# Patient Record
Sex: Female | Born: 1955 | Race: White | Hispanic: No | Marital: Single | Smoking: Never smoker
Health system: Southern US, Community
[De-identification: ages and names within clinical notes are randomized; demographics above are authoritative.]

## PROBLEM LIST (undated history)

## (undated) DIAGNOSIS — K635 Polyp of colon: Secondary | ICD-10-CM

## (undated) DIAGNOSIS — T7840XA Allergy, unspecified, initial encounter: Secondary | ICD-10-CM

## (undated) DIAGNOSIS — E039 Hypothyroidism, unspecified: Secondary | ICD-10-CM

## (undated) DIAGNOSIS — Z86718 Personal history of other venous thrombosis and embolism: Secondary | ICD-10-CM

## (undated) DIAGNOSIS — E785 Hyperlipidemia, unspecified: Secondary | ICD-10-CM

## (undated) HISTORY — PX: COLONOSCOPY: SHX174

## (undated) HISTORY — PX: SHOULDER SURGERY: SHX246

## (undated) HISTORY — DX: Allergy, unspecified, initial encounter: T78.40XA

## (undated) HISTORY — DX: Personal history of other venous thrombosis and embolism: Z86.718

## (undated) HISTORY — DX: Polyp of colon: K63.5

## (undated) HISTORY — DX: Hyperlipidemia, unspecified: E78.5

## (undated) HISTORY — DX: Hypothyroidism, unspecified: E03.9

---

## 1987-10-09 HISTORY — PX: BREAST BIOPSY: SHX20

## 1997-11-12 ENCOUNTER — Ambulatory Visit (HOSPITAL_COMMUNITY): Admission: RE | Admit: 1997-11-12 | Discharge: 1997-11-12 | Payer: Self-pay | Admitting: Obstetrics & Gynecology

## 2000-11-20 ENCOUNTER — Other Ambulatory Visit: Admission: RE | Admit: 2000-11-20 | Discharge: 2000-11-20 | Payer: Self-pay | Admitting: Obstetrics & Gynecology

## 2002-07-09 ENCOUNTER — Other Ambulatory Visit: Admission: RE | Admit: 2002-07-09 | Discharge: 2002-07-09 | Payer: Self-pay | Admitting: Obstetrics & Gynecology

## 2004-04-27 ENCOUNTER — Ambulatory Visit (HOSPITAL_COMMUNITY): Admission: RE | Admit: 2004-04-27 | Discharge: 2004-04-27 | Payer: Self-pay | Admitting: Emergency Medicine

## 2004-05-12 ENCOUNTER — Other Ambulatory Visit: Admission: RE | Admit: 2004-05-12 | Discharge: 2004-05-12 | Payer: Self-pay | Admitting: Obstetrics & Gynecology

## 2004-07-31 ENCOUNTER — Encounter: Admission: RE | Admit: 2004-07-31 | Discharge: 2004-07-31 | Payer: Self-pay | Admitting: Obstetrics and Gynecology

## 2004-08-14 ENCOUNTER — Encounter: Admission: RE | Admit: 2004-08-14 | Discharge: 2004-08-14 | Payer: Self-pay | Admitting: Obstetrics & Gynecology

## 2004-12-04 ENCOUNTER — Encounter: Admission: RE | Admit: 2004-12-04 | Discharge: 2004-12-04 | Payer: Self-pay | Admitting: Obstetrics & Gynecology

## 2005-07-12 ENCOUNTER — Other Ambulatory Visit: Admission: RE | Admit: 2005-07-12 | Discharge: 2005-07-12 | Payer: Self-pay | Admitting: Obstetrics & Gynecology

## 2007-09-10 ENCOUNTER — Encounter: Admission: RE | Admit: 2007-09-10 | Discharge: 2007-09-10 | Payer: Self-pay | Admitting: Obstetrics & Gynecology

## 2009-07-15 ENCOUNTER — Encounter: Admission: RE | Admit: 2009-07-15 | Discharge: 2009-07-15 | Payer: Self-pay | Admitting: Obstetrics & Gynecology

## 2012-04-26 ENCOUNTER — Ambulatory Visit (INDEPENDENT_AMBULATORY_CARE_PROVIDER_SITE_OTHER): Payer: BC Managed Care – PPO | Admitting: Physician Assistant

## 2012-04-26 ENCOUNTER — Ambulatory Visit: Payer: BC Managed Care – PPO

## 2012-04-26 VITALS — BP 128/76 | HR 84 | Temp 98.1°F | Resp 16 | Ht 65.0 in | Wt 187.0 lb

## 2012-04-26 DIAGNOSIS — M2550 Pain in unspecified joint: Secondary | ICD-10-CM

## 2012-04-26 DIAGNOSIS — E785 Hyperlipidemia, unspecified: Secondary | ICD-10-CM

## 2012-04-26 DIAGNOSIS — E039 Hypothyroidism, unspecified: Secondary | ICD-10-CM

## 2012-04-26 LAB — POCT CBC
Granulocyte percent: 44.8 %G (ref 37–80)
HCT, POC: 45.3 % (ref 37.7–47.9)
Hemoglobin: 13.8 g/dL (ref 12.2–16.2)
Lymph, poc: 2.7 (ref 0.6–3.4)
MCH, POC: 30 pg (ref 27–31.2)
MCHC: 30.5 g/dL — AB (ref 31.8–35.4)
MCV: 98.5 fL — AB (ref 80–97)
MID (cbc): 0.5 (ref 0–0.9)
MPV: 9.1 fL (ref 0–99.8)
POC Granulocyte: 2.6 (ref 2–6.9)
POC LYMPH PERCENT: 47.4 %L (ref 10–50)
POC MID %: 7.8 %M (ref 0–12)
Platelet Count, POC: 408 10*3/uL (ref 142–424)
RBC: 4.6 M/uL (ref 4.04–5.48)
RDW, POC: 13.1 %
WBC: 5.8 10*3/uL (ref 4.6–10.2)

## 2012-04-26 LAB — COMPREHENSIVE METABOLIC PANEL
ALT: 16 U/L (ref 0–35)
AST: 20 U/L (ref 0–37)
Albumin: 4.5 g/dL (ref 3.5–5.2)
Alkaline Phosphatase: 104 U/L (ref 39–117)
BUN: 10 mg/dL (ref 6–23)
CO2: 27 mEq/L (ref 19–32)
Calcium: 9.7 mg/dL (ref 8.4–10.5)
Chloride: 107 mEq/L (ref 96–112)
Creat: 0.67 mg/dL (ref 0.50–1.10)
Glucose, Bld: 101 mg/dL — ABNORMAL HIGH (ref 70–99)
Potassium: 4.4 mEq/L (ref 3.5–5.3)
Sodium: 141 mEq/L (ref 135–145)
Total Bilirubin: 0.7 mg/dL (ref 0.3–1.2)
Total Protein: 7.3 g/dL (ref 6.0–8.3)

## 2012-04-26 LAB — LIPID PANEL
Cholesterol: 228 mg/dL — ABNORMAL HIGH (ref 0–200)
HDL: 66 mg/dL (ref >39)
LDL Cholesterol: 137 mg/dL — ABNORMAL HIGH (ref 0–99)
Total CHOL/HDL Ratio: 3.5 Ratio
Triglycerides: 126 mg/dL (ref <150)
VLDL: 25 mg/dL (ref 0–40)

## 2012-04-26 LAB — RHEUMATOID FACTOR: Rheumatoid fact SerPl-aCnc: 69 IU/mL — ABNORMAL HIGH (ref <=14)

## 2012-04-26 LAB — TSH: TSH: 0.016 u[IU]/mL — ABNORMAL LOW (ref 0.350–4.500)

## 2012-04-26 LAB — POCT SEDIMENTATION RATE: POCT SED RATE: 33 mm/hr — AB (ref 0–22)

## 2012-04-26 MED ORDER — HYDROCODONE-ACETAMINOPHEN 5-325 MG PO TABS: 1.0000 | ORAL_TABLET | ORAL | Status: AC | PRN

## 2012-04-26 MED ORDER — MELOXICAM 15 MG PO TABS: 15.0000 mg | ORAL_TABLET | Freq: Every day | ORAL | Status: DC

## 2012-04-26 NOTE — Progress Notes (Signed)
56 year old White is here today with complaints of R thumb pain and medication refill. Pt states she has not has any falls or injuries. Pt states she woke up early Monday morning with thumb pain.  Pt states she also needs a refill on Synthroid 125 mcg. Pt is fasting. Pt states she has no other complaints.

## 2012-04-26 NOTE — Progress Notes (Signed)
Subjective:    Patient ID: Alisha Welch, female    DOB: [birth year], 56 y.o.   MRN: [id number]  HPI Alisha Welch comes in today complaining of right thumb pain for 6 days.  She states that she was awakened by thumb pain Monday morning.  She flexed her thumb and notes that it got struck at the IP joint in flexion.  She had to force it into extension. She has had increasing pain and swelling of the whole thumb and notes that the sensation is moving up her arm.  She has not noticed any redness or warmth.  She has no other arthralgias.  She has never had a trigger finger before.  She denies any injury or repetitive motion although states that she was pulling weeds several days prior to the onset of pain.  She has taken ibuprofen without relief.   She requests refill of her Synthroid.  Her last TSH was August 2012 and was low.  She did not adjust from 125 and never knew to follow up.  She feels well, though, is not having new symptoms.  She notes that her cholesterol was elevated last year as well.  She has made some lifestyle changes but has not had labs rechecked.   No CPE in some time.     Review of Systems As noted in HPI, otherwise negative     Objective:   Physical Exam  Constitutional: Vital signs are normal. She appears well-developed and well-nourished.  Neck: Thyromegaly present. No mass present.  Cardiovascular: Normal rate and regular rhythm.   Pulmonary/Chest: Effort normal and breath sounds normal.  Musculoskeletal:       Right hand: She exhibits decreased range of motion, tenderness and bony tenderness. She exhibits normal capillary refill, no laceration and no swelling. normal sensation noted.       Right thumb is slightly swollen.  Tender to palpation most significantly over palmar aspect of MCP and less tender at IP joint.  Unable to flexion due to pain.  Abduction and adduction are slightly painful. No erythema or warmth noted.   No pain with wrist ROM.  Neurological: She is  alert. No sensory deficit.    Results for orders placed in visit on 04/26/12  POCT CBC      Component Value Range   WBC 5.8  4.6 - 10.2 K/uL   Lymph, poc 2.7  0.6 - 3.4   POC LYMPH PERCENT 47.4  10 - 50 %L   MID (cbc) 0.5  0 - 0.9   POC MID % 7.8  0 - 12 %M   POC Granulocyte 2.6  2 - 6.9   Granulocyte percent 44.8  37 - 80 %G   RBC 4.60  4.04 - 5.48 M/uL   Hemoglobin 13.8  12.2 - 16.2 g/dL   HCT, POC 40.9  81.1 - 47.9 %   MCV 98.5 (*) 80 - 97 fL   MCH, POC 30.0  27 - 31.2 pg   MCHC 30.5 (*) 31.8 - 35.4 g/dL   RDW, POC 91.4     Platelet Count, POC 408  142 - 424 K/uL   MPV 9.1  0 - 99.8 fL    [facility] reading (PRIMARY) by  Dr. Perrin Maltese Negative        Assessment & Plan:  Hypothyroidism Dyslipidemia Thumb pain, likely trigger finger/tenosynovitis from over use  Check Sed Rate, Lipid, TSH, ANA, RF Mobic 15mg  qd Vicodin for pain Thumb spica splint Ice Call if no improvement  1 week Refill Synthroid when labs returned Needs CPE

## 2012-04-28 LAB — ANA: Anti Nuclear Antibody(ANA): NEGATIVE

## 2012-05-04 ENCOUNTER — Telehealth: Payer: Self-pay

## 2012-05-04 NOTE — Telephone Encounter (Signed)
Pt received letter that we could not reach her about her labs. Please call cell [phone], I am correcting phone# in Epic

## 2012-05-04 NOTE — Telephone Encounter (Signed)
ADVISED PT OF LAB RESULTS.  PT WOULD LIKE TO BE REFERRED TO RHEUMATOLOGY.  SHE STARTS WORK ON AUG 21ST AND WOULD LIKE SOMETHING BEFORE THEN.  PT NOT AVAILABLE ON AUG 8 OR 12.  IF APPT MADE AFTER AUG 21ST THEN THE APPT NEEDS TO LIKE AT 4PM.

## 2012-05-05 ENCOUNTER — Telehealth: Payer: Self-pay

## 2012-05-05 DIAGNOSIS — R768 Other specified abnormal immunological findings in serum: Secondary | ICD-10-CM

## 2012-05-05 DIAGNOSIS — M79643 Pain in unspecified hand: Secondary | ICD-10-CM

## 2012-05-05 NOTE — Telephone Encounter (Signed)
I have spoken to Dr Patsy Lager and she does want to refer patient to a Rheumatologist but does not think patient definitely has RA there are other factors involved other than just abnormal labs. I have called patient and advised this is not a diagnosis, this is some abnormal labs that need to be further elevated. Will work on this for her. She will d/c her splint and come back in for eval if not improved.

## 2012-05-05 NOTE — Telephone Encounter (Signed)
PT STATES SHE WAS SEEN BY ALICIA LAST WEEK, HER RIGHT HAND IS TOTALLY NUMB AND SHE IS REALLY CONCERNED ABOUT IT. HAD SPOKEN WITH SOMEONE ON Saturday BUT WAS SATISFIED WITH WHAT SHE WAS TOLD. ONLY WANT A DR OR PA TO GIVE HER A CALL BACK. PLEASE CALL [phone]

## 2012-05-05 NOTE — Telephone Encounter (Signed)
I spoke to patient and she is very tearful and she does not know how much she needs to be using her hand. She is c/o increased pain ans swelling of her entire hand including thumb. She wants to know how much activity she should be doing and if there is anything else she should or should not be doing. Please advise, I told her if she is getting worse we may need to see her again.

## 2012-05-05 NOTE — Addendum Note (Signed)
Addended byCaffie Damme on: 05/05/2012 12:00 PM   Modules accepted: Orders

## 2012-05-06 ENCOUNTER — Encounter (HOSPITAL_BASED_OUTPATIENT_CLINIC_OR_DEPARTMENT_OTHER): Payer: Self-pay | Admitting: Emergency Medicine

## 2012-05-06 ENCOUNTER — Emergency Department (HOSPITAL_BASED_OUTPATIENT_CLINIC_OR_DEPARTMENT_OTHER)
Admission: EM | Admit: 2012-05-06 | Discharge: 2012-05-06 | Disposition: A | Discharge status: Home / Self Care | Payer: BC Managed Care – PPO | Attending: Emergency Medicine | Admitting: Emergency Medicine

## 2012-05-06 DIAGNOSIS — E039 Hypothyroidism, unspecified: Secondary | ICD-10-CM | POA: Insufficient documentation

## 2012-05-06 DIAGNOSIS — M79609 Pain in unspecified limb: Secondary | ICD-10-CM | POA: Insufficient documentation

## 2012-05-06 DIAGNOSIS — M79643 Pain in unspecified hand: Secondary | ICD-10-CM

## 2012-05-06 MED ORDER — ONDANSETRON 8 MG PO TBDP
8.0000 mg | ORAL_TABLET | ORAL | Status: AC
  Administered 2012-05-06: 8 mg via ORAL
  Filled 2012-05-06: qty 1

## 2012-05-06 MED ORDER — OXYCODONE-ACETAMINOPHEN 5-325 MG PO TABS: 1.0000 | ORAL_TABLET | Freq: Four times a day (QID) | ORAL | Status: AC | PRN

## 2012-05-06 MED ORDER — HYDROMORPHONE HCL PF 1 MG/ML IJ SOLN
1.0000 mg | Freq: Once | INTRAMUSCULAR | Status: AC
  Administered 2012-05-06: 1 mg via INTRAMUSCULAR
  Filled 2012-05-06: qty 1

## 2012-05-06 NOTE — ED Notes (Signed)
MD at bedside. 

## 2012-05-06 NOTE — ED Notes (Signed)
Pt has pain in right thumb. Pt was seen by urgent care July 15 for same with no relief of pain.

## 2012-05-06 NOTE — ED Provider Notes (Signed)
History     CSN: [id number]  Arrival date & time 05/06/12  0141   First MD Initiated Contact with Patient 05/06/12 0158      Chief Complaint  Patient presents with  . Hand Pain    (Consider location/radiation/quality/duration/timing/severity/associated sxs/prior treatment) HPI Pt with pain in right thumb.  Pain has been present for approx 2 weeks. Pain is constant and throbbing.  She also feels that thumb/hand/first finger are swollen.  No redness overlying, no fevers.  No known trauma.  Pt was seen at [facility] 2 weeks ago- was placed in wrist splint, given pain control.  Pain meds ran out yesterday.  Tonight the pain is worse.  She also had labs drawn at that time and RH factor was positive.  She is making arrangements to f/u with rheumatology.  Movement and palpation makes symptoms worse.  There are no other associated systemic symptoms, there are no other alleviating or modifying factors.  There are no other associated systemic symptoms, there are no other alleviating or modifying factors.   Past Medical History  Diagnosis Date  . Hypothyroid     Past Surgical History  Procedure Date  . Breast biopsy     No family history on file.  History  Substance Use Topics  . Smoking status: Never Smoker   . Smokeless tobacco: Not on file  . Alcohol Use: Yes     occassionally    OB History    Grav Para Term Preterm Abortions TAB SAB Ect Mult Living                  Review of Systems ROS reviewed and all otherwise negative except for mentioned in HPI  Allergies  Review of patient's allergies indicates no known allergies.  Home Medications   Current Outpatient Rx  Name Route Sig Dispense Refill  . LEVOTHYROXINE SODIUM 125 MCG PO TABS Oral Take 125 mcg by mouth daily.    . MELOXICAM 15 MG PO TABS Oral Take 1 tablet (15 mg total) by mouth daily. 30 tablet 0  . OXYCODONE-ACETAMINOPHEN 5-325 MG PO TABS Oral Take 1-2 tablets by mouth every 6 (six) hours as needed for  pain. 15 tablet 0    BP 164/83  Pulse 80  Temp 97.9 F (36.6 C) (Oral)  Resp 24  Ht 5\' 5"  (1.651 m)  SpO2 100% Vitals reviewed Physical Exam Physical Examination: General appearance - alert, well appearing, and in no distress Mental status - alert, oriented to person, place, and time Eyes - no conjunctival injection or scleral icterus Mouth - mucous membranes moist, pharynx normal without lesions Chest - clear to auscultation, no wheezes, rales or rhonchi, symmetric air entry Heart - normal rate, regular rhythm, normal S1, S2, no murmurs, rubs, clicks or gallops Neurological - alert, oriented, normal speech, normal grip strength bilaterally, flexion and extension intact of hands/fingers bilaterally, sensation intact distally to light touch Musculoskeletal - diffuse ttp over MP joint, thenar eminence, PIP joint of thumb and PIP joint of 2nd finger,no deformity, no overlying erythema or warmth Extremities - peripheral pulses normal, no pedal edema, no clubbing or cyanosis Skin - normal coloration and turgor, no rashes, no suspicious skin lesions noted Psych- anxious and tearful during history  ED Course  Procedures (including critical care time)  Labs Reviewed - No data to display No results found.   1. Pain in hand       MDM  Pt presenting with pain nad swelling in right hand  over the past 2 weeks.  Pain meds ran out yesterday.  She is arranging rheum followup through her PMD.  Low suspicion for septic joint, xrays were obtained 2 weeks ago and no new trauma.  Pt advised to continue with wrist splint and given rx for pain medicaiton.  She is going to see her PMD tomorrow.  Also given referral to hand surgery        Ethelda Chick, MD 05/08/12 1315

## 2012-05-06 NOTE — ED Notes (Signed)
Pt was dx with "trigger thumb"

## 2012-05-07 ENCOUNTER — Telehealth: Payer: Self-pay

## 2012-05-07 NOTE — Telephone Encounter (Signed)
Labs printed and x-rays on disc. Patient notified and on her way.

## 2012-05-07 NOTE — Telephone Encounter (Signed)
The patient would like copy of blood work and xray done 04/26/12 for appointment with hand specialist tomorrow.  The patient would like to pick the copies up today before 7pm.  Please call the patient at [phone] when ready.

## 2012-05-07 NOTE — Telephone Encounter (Signed)
PT WOULD LIKE A COPY OF HER BLOOD WORK AND X-RAY FROM HER VISIT ON July 20TH, PT STATES THAT SHE BETTER HAVE IT TODAY. [phone]

## 2012-05-10 NOTE — Telephone Encounter (Signed)
I am happy to refer pt to rheumatology but at her visit it looked like Helmut Muster thought she had a trigger finger and with her labs normal a ortho might be a better referral.  Please see which one pt wold like.

## 2012-05-11 NOTE — Telephone Encounter (Signed)
Spoke with pt, she is very upset and feels we have dropped the ball. I spent 30 minutes on phone with pt. She wants Alisha Welch to call her on Monday. First she states we didn't change her number in the system when she came in  Spoke with patient who is upset about several different things.  1st - her phone number was not updated at check in and we were unable to contact her with her lab results.  She was given clinical information that might not actually be true and her pain is not controlled.  She had to go to ER for pain control and feels that she is being given a run around by our office.  She would like to speak with Alisha Welch about her situation when she returns.

## 2012-05-13 MED ORDER — HYDROCODONE-ACETAMINOPHEN 5-325 MG PO TABS: 1.0000 | ORAL_TABLET | ORAL | Status: AC | PRN

## 2012-05-13 MED ORDER — LEVOTHYROXINE SODIUM 112 MCG PO TABS: 112.0000 ug | ORAL_TABLET | Freq: Every day | ORAL | Status: DC

## 2012-05-13 NOTE — Telephone Encounter (Signed)
See previous entry  At the end of our conversation, I acknowledged her disappointment and tried to assure her that these were unusual circumstances and that I had hoped that she would be able to remember the good care she and her family have had with [place] over the many years before this.  She was grateful for the call and long discussion.  We will move forward with the rheum referral.  She will still follow up with ortho and plan to return in 4-6 weeks for labs.  I did not mention it on the phone at the time of the conversation, but she does need a physical.  Perhaps she needs to reestablish with a new provider in our group before that occurs.

## 2012-05-13 NOTE — Addendum Note (Signed)
Addended by: Pattricia Boss on: 05/13/2012 03:07 PM   Modules accepted: Orders

## 2012-05-13 NOTE — Telephone Encounter (Signed)
I called Ms. Ficken today after being out of the office for the last 10 days.  She expressed several significant concerns about the process of receiving lab results, getting referrals made, getting information, her pain level and other concerns noted in the previous messages.  I was in contact with her on the phone for 55 minutes.  Overall, she is better and is pursuing care with ortho but would like to be referred to rheumatology.  I offered her more pain medication and will have that sent in.    Also, I reviewed her labs with her directly.    Her BS was 1 point elevated and she states that she has already made diet and exercise changes.   Her cholesterol was elevated and encouraged her to continue her changes as that my decrease her numbers. Her TSH was too low and we will decrease her synthroid to 112 to ensure that her readings normalize.  She needs to have that repeated in 4-6 weeks. I will send this in today.  At the end of our conversation, I acknowledged her disappointment and tried to re

## 2012-05-13 NOTE — Telephone Encounter (Signed)
vicodin Rx called into [facility]/[address]

## 2012-05-27 ENCOUNTER — Encounter: Payer: Self-pay | Admitting: Physician Assistant

## 2012-05-27 DIAGNOSIS — M659 Synovitis and tenosynovitis, unspecified: Secondary | ICD-10-CM

## 2012-05-27 DIAGNOSIS — M65949 Unspecified synovitis and tenosynovitis, unspecified hand: Secondary | ICD-10-CM

## 2012-06-06 ENCOUNTER — Telehealth: Payer: Self-pay

## 2012-06-06 NOTE — Telephone Encounter (Signed)
The patient called regarding her appt with Rheumatology on 05/27/12 and her results. Please call patient at [phone].

## 2012-06-07 NOTE — Telephone Encounter (Signed)
Spoke with patient-- she states that she saw Rheum on 8/20 and had labs done to rule out RA and has not heard from them yet about results.  Was wanting to see if we have received results so she does not have to wait all weekend for them.  Do not see any mention of this yet, will check with medical records and if we haven't received by Tuesday--please contact Rheum's office for results.  Patient plans to continue to go to ortho on 9/3 unless told otherwise.

## 2012-06-09 NOTE — Telephone Encounter (Signed)
I checked through all faxes not yet checked by [facility] and did not see the report from pt's rheum OV. We will need to contact Rheum office on Tues.

## 2012-06-11 ENCOUNTER — Other Ambulatory Visit: Payer: Self-pay | Admitting: Orthopedic Surgery

## 2012-06-11 NOTE — Telephone Encounter (Signed)
Nurse CB from Dr Ewell Poe office and reported that they talked w/pt yesterday about her results - no need for [place] to call pt back. Called their Med Recs dept and requested fax of OV/Labs for our records.

## 2012-06-11 NOTE — Telephone Encounter (Signed)
Called Dr Ewell Poe office and asked if results on pt's labs are back and if pt has been notified and if we can get a record of results. I was told that the nurse will check on this and call me back.

## 2012-06-12 ENCOUNTER — Encounter (HOSPITAL_BASED_OUTPATIENT_CLINIC_OR_DEPARTMENT_OTHER): Payer: Self-pay | Admitting: *Deleted

## 2012-06-12 NOTE — Progress Notes (Signed)
No cardiac or resp problems No labs needed 

## 2012-06-13 ENCOUNTER — Encounter (HOSPITAL_BASED_OUTPATIENT_CLINIC_OR_DEPARTMENT_OTHER): Payer: Self-pay | Admitting: Anesthesiology

## 2012-06-13 ENCOUNTER — Ambulatory Visit (HOSPITAL_BASED_OUTPATIENT_CLINIC_OR_DEPARTMENT_OTHER): Payer: BC Managed Care – PPO | Admitting: Anesthesiology

## 2012-06-13 ENCOUNTER — Ambulatory Visit (HOSPITAL_BASED_OUTPATIENT_CLINIC_OR_DEPARTMENT_OTHER)
Admission: RE | Admit: 2012-06-13 | Discharge: 2012-06-13 | Disposition: A | Discharge status: Home / Self Care | Payer: BC Managed Care – PPO | Source: Ambulatory Visit | Attending: Orthopedic Surgery | Admitting: Orthopedic Surgery

## 2012-06-13 ENCOUNTER — Encounter (HOSPITAL_BASED_OUTPATIENT_CLINIC_OR_DEPARTMENT_OTHER): Payer: Self-pay | Admitting: Orthopedic Surgery

## 2012-06-13 ENCOUNTER — Encounter (HOSPITAL_BASED_OUTPATIENT_CLINIC_OR_DEPARTMENT_OTHER)
Admission: RE | Disposition: A | Discharge status: Home / Self Care | Payer: Self-pay | Source: Ambulatory Visit | Attending: Orthopedic Surgery

## 2012-06-13 DIAGNOSIS — M65839 Other synovitis and tenosynovitis, unspecified forearm: Secondary | ICD-10-CM | POA: Insufficient documentation

## 2012-06-13 DIAGNOSIS — E039 Hypothyroidism, unspecified: Secondary | ICD-10-CM | POA: Insufficient documentation

## 2012-06-13 HISTORY — PX: TRIGGER FINGER RELEASE: SHX641

## 2012-06-13 SURGERY — RELEASE, A1 PULLEY, FOR TRIGGER FINGER
Anesthesia: Regional | Site: Thumb | Laterality: Right | Wound class: Clean

## 2012-06-13 MED ORDER — CEFAZOLIN SODIUM-DEXTROSE 2-3 GM-% IV SOLR
2.0000 g | INTRAVENOUS | Status: AC
  Administered 2012-06-13: 2 g via INTRAVENOUS

## 2012-06-13 MED ORDER — ONDANSETRON HCL 4 MG/2ML IJ SOLN
INTRAMUSCULAR | Status: DC | PRN
  Administered 2012-06-13: 4 mg via INTRAVENOUS

## 2012-06-13 MED ORDER — PROPOFOL 10 MG/ML IV BOLUS
INTRAVENOUS | Status: DC | PRN
  Administered 2012-06-13 (×2): 20 mg via INTRAVENOUS

## 2012-06-13 MED ORDER — CHLORHEXIDINE GLUCONATE 4 % EX LIQD: 60.0000 mL | Freq: Once | CUTANEOUS | Status: DC

## 2012-06-13 MED ORDER — BUPIVACAINE HCL (PF) 0.25 % IJ SOLN
INTRAMUSCULAR | Status: DC | PRN
  Administered 2012-06-13: 3 mL

## 2012-06-13 MED ORDER — MIDAZOLAM HCL 2 MG/2ML IJ SOLN: 1.0000 mg | INTRAMUSCULAR | Status: DC | PRN

## 2012-06-13 MED ORDER — LACTATED RINGERS IV SOLN
INTRAVENOUS | Status: DC
  Administered 2012-06-13: 12:00:00 via INTRAVENOUS

## 2012-06-13 MED ORDER — PROMETHAZINE HCL 25 MG/ML IJ SOLN: 6.2500 mg | INTRAMUSCULAR | Status: DC | PRN

## 2012-06-13 MED ORDER — MIDAZOLAM HCL 5 MG/5ML IJ SOLN
INTRAMUSCULAR | Status: DC | PRN
  Administered 2012-06-13: 2 mg via INTRAVENOUS

## 2012-06-13 MED ORDER — LIDOCAINE HCL (CARDIAC) 20 MG/ML IV SOLN
INTRAVENOUS | Status: DC | PRN
  Administered 2012-06-13: 50 mg via INTRAVENOUS

## 2012-06-13 MED ORDER — FENTANYL CITRATE 0.05 MG/ML IJ SOLN: 25.0000 ug | INTRAMUSCULAR | Status: DC | PRN

## 2012-06-13 MED ORDER — VICODIN 5-500 MG PO TABS: 1.0000 | ORAL_TABLET | Freq: Four times a day (QID) | ORAL | Status: AC | PRN

## 2012-06-13 MED ORDER — HYDROCODONE-ACETAMINOPHEN 5-325 MG PO TABS
1.0000 | ORAL_TABLET | Freq: Once | ORAL | Status: AC
  Administered 2012-06-13: 1 via ORAL

## 2012-06-13 MED ORDER — FENTANYL CITRATE 0.05 MG/ML IJ SOLN
INTRAMUSCULAR | Status: DC | PRN
  Administered 2012-06-13: 50 ug via INTRAVENOUS

## 2012-06-13 MED ORDER — FENTANYL CITRATE 0.05 MG/ML IJ SOLN: 50.0000 ug | INTRAMUSCULAR | Status: DC | PRN

## 2012-06-13 SURGICAL SUPPLY — 34 items
BANDAGE COBAN STERILE 2 (GAUZE/BANDAGES/DRESSINGS) ×2 IMPLANT
BLADE SURG 15 STRL LF DISP TIS (BLADE) ×1 IMPLANT
BLADE SURG 15 STRL SS (BLADE) ×1
BNDG ESMARK 4X9 LF (GAUZE/BANDAGES/DRESSINGS) IMPLANT
CHLORAPREP W/TINT 26ML (MISCELLANEOUS) ×2 IMPLANT
CLOTH BEACON ORANGE TIMEOUT ST (SAFETY) ×2 IMPLANT
CORDS BIPOLAR (ELECTRODE) IMPLANT
COVER MAYO STAND STRL (DRAPES) ×2 IMPLANT
COVER TABLE BACK 60X90 (DRAPES) ×2 IMPLANT
CUFF TOURNIQUET SINGLE 18IN (TOURNIQUET CUFF) ×2 IMPLANT
DECANTER SPIKE VIAL GLASS SM (MISCELLANEOUS) IMPLANT
DRAPE EXTREMITY T 121X128X90 (DRAPE) ×2 IMPLANT
DRAPE SURG 17X23 STRL (DRAPES) ×2 IMPLANT
GAUZE SPONGE 4X4 12PLY STRL LF (GAUZE/BANDAGES/DRESSINGS) ×6 IMPLANT
GAUZE XEROFORM 1X8 LF (GAUZE/BANDAGES/DRESSINGS) ×2 IMPLANT
GLOVE BIO SURGEON STRL SZ 6.5 (GLOVE) ×2 IMPLANT
GLOVE SKINSENSE NS SZ7.0 (GLOVE) ×1
GLOVE SKINSENSE STRL SZ7.0 (GLOVE) ×1 IMPLANT
GLOVE SURG ORTHO 8.0 STRL STRW (GLOVE) ×2 IMPLANT
GOWN BRE IMP PREV XXLGXLNG (GOWN DISPOSABLE) ×2 IMPLANT
GOWN PREVENTION PLUS XLARGE (GOWN DISPOSABLE) ×2 IMPLANT
NEEDLE 27GAX1X1/2 (NEEDLE) ×2 IMPLANT
NS IRRIG 1000ML POUR BTL (IV SOLUTION) ×2 IMPLANT
PACK BASIN DAY SURGERY FS (CUSTOM PROCEDURE TRAY) ×2 IMPLANT
PADDING CAST ABS 4INX4YD NS (CAST SUPPLIES) ×1
PADDING CAST ABS COTTON 4X4 ST (CAST SUPPLIES) ×1 IMPLANT
SPONGE GAUZE 4X4 12PLY (GAUZE/BANDAGES/DRESSINGS) IMPLANT
STOCKINETTE 4X48 STRL (DRAPES) ×2 IMPLANT
SUT VICRYL RAPIDE 4/0 PS 2 (SUTURE) ×2 IMPLANT
SYR BULB 3OZ (MISCELLANEOUS) ×2 IMPLANT
SYR CONTROL 10ML LL (SYRINGE) IMPLANT
TOWEL OR 17X24 6PK STRL BLUE (TOWEL DISPOSABLE) ×4 IMPLANT
UNDERPAD 30X30 INCONTINENT (UNDERPADS AND DIAPERS) ×2 IMPLANT
WATER STERILE IRR 1000ML POUR (IV SOLUTION) ×2 IMPLANT

## 2012-06-13 NOTE — Transfer of Care (Signed)
Immediate Anesthesia Transfer of Care Note  Patient: Alisha Welch  Procedure(s) Performed: Procedure(s) (LRB) with comments: RELEASE TRIGGER FINGER/A-1 PULLEY (Right) - trigger release right thumb   Patient Location: PACU  Anesthesia Type: MAC and Bier block  Level of Consciousness: awake, alert  and oriented  Airway & Oxygen Therapy: Patient Spontanous Breathing and Patient connected to face mask oxygen  Post-op Assessment: Report given to PACU RN and Post -op Vital signs reviewed and stable  Post vital signs: Reviewed and stable  Complications: No apparent anesthesia complications

## 2012-06-13 NOTE — Anesthesia Preprocedure Evaluation (Signed)
Anesthesia Evaluation  Patient identified by MRN, date of birth, ID band Patient awake    Reviewed: Allergy & Precautions, H&P , NPO status , Patient's Chart, lab work & pertinent test results  Airway Mallampati: I TM Distance: >3 FB Neck ROM: Full    Dental   Pulmonary  breath sounds clear to auscultation        Cardiovascular Rhythm:Regular Rate:Normal     Neuro/Psych  Neuromuscular disease    GI/Hepatic   Endo/Other  Hypothyroidism   Renal/GU      Musculoskeletal   Abdominal   Peds  Hematology   Anesthesia Other Findings   Reproductive/Obstetrics                           Anesthesia Physical Anesthesia Plan  ASA: II  Anesthesia Plan: MAC and Bier Block   Post-op Pain Management:    Induction: Intravenous  Airway Management Planned: Simple Face Mask  Additional Equipment:   Intra-op Plan:   Post-operative Plan:   Informed Consent: I have reviewed the patients History and Physical, chart, labs and discussed the procedure including the risks, benefits and alternatives for the proposed anesthesia with the patient or authorized representative who has indicated his/her understanding and acceptance.     Plan Discussed with: CRNA and Surgeon  Anesthesia Plan Comments:         Anesthesia Quick Evaluation

## 2012-06-13 NOTE — H&P (Signed)
     Alisha Welch is a 57 year-old right-hand dominant female referred by Dr. Robert Bellow for consultation with respect to pain and swelling of the right thumb. This has been going on since July 15th. She woke up with it. She states it was catching. She complains of pain at the metacarpophalangeal joint, she was treated with splint. X-rays were negative.  She was placed on meloxicam and told to use ice. She woke up with a great deal of pain, went to the emergency department and given Vicodin to take for her discomfort.  She complains  of a constant, severe, throbbing, aching burning type pain to the thumb with a feeling of swelling and weakness. She is not complaining of any numbness or tingling. She states heat in the morning under the shower will help mobilize this. Movement of the thumb cause pain for her .She has history of thyroid problems.  No history of diabetes, arthritis or gout.  There is family history of diabetes, gout and thyroid problems. She has had laboratory work revealing an elevated rheumatoid factor, but has no other symptoms of rheumatoid arthritis and no other complaints of pain in other joints. She has history of dislocation to the PIP joint in the right little finger.   ALLERGIES:   None.  MEDICATIONS:    Synthroid.  SURGICAL HISTORY:    Biopsy in 1989.  FAMILY MEDICAL HISTORY:     Positive for diabetes, heart disease, high blood pressure.  SOCIAL HISTORY:     She does not smoke or drink. She is married.   REVIEW OF SYSTEMS:     Positive for blood clots as a result of a skiing accident, otherwise negative 14 points. Alisha Welch is an 56 y.o. female.   Chief Complaint: STS Rt thumb HPI: see above  Past Medical History  Diagnosis Date  . Hypothyroid     Past Surgical History  Procedure Date  . Breast biopsy   . Abdominal hysterectomy     No family history on file. Social History:  reports that she has never smoked. She does not have any smokeless tobacco history  on file. She reports that she drinks alcohol. She reports that she does not use illicit drugs.  Allergies: No Known Allergies  No prescriptions prior to admission    No results found for this or any previous visit (from the past 48 hour(s)).  No results found.   Pertinent items are noted in HPI.  There were no vitals taken for this visit.  General appearance: alert, cooperative and appears stated age Head: Normocephalic, without obvious abnormality Neck: no adenopathy Resp: clear to auscultation bilaterally Cardio: regular rate and rhythm, S1, S2 normal, no murmur, click, rub or gallop GI: soft, non-tender; bowel sounds normal; no masses,  no organomegaly Extremities: extremities normal, atraumatic, no cyanosis or edema Pulses: 2+ and symmetric Skin: Skin color, texture, turgor normal. No rashes or lesions Neurologic: Grossly normal Incision/Wound: na  Assessment/Plan The pre, peri and postoperative course were discussed along with the risks and complications.  The patient is aware there is no guarantee with the surgery, possibility of infection, recurrence, injury to arteries, nerves, tendons, incomplete relief of symptoms and dystrophy.  They would like to proceed.   She is scheduled for release stenosing tenosynovitis right thumb as an outpatient.  Mattias Walmsley R 06/13/2012, 10:04 AM

## 2012-06-13 NOTE — Brief Op Note (Signed)
06/13/2012  1:41 PM  PATIENT:  Alisha Welch  56 y.o. female  PRE-OPERATIVE DIAGNOSIS:  sts right thumb  POST-OPERATIVE DIAGNOSIS:  sts right thumb  PROCEDURE:  Procedure(s) (LRB) with comments: RELEASE TRIGGER FINGER/A-1 PULLEY (Right) - trigger release right thumb   SURGEON:  Surgeon(s) and Role:    * Nicki Reaper, MD - Primary  PHYSICIAN ASSISTANT:   ASSISTANTS: none   ANESTHESIA:   local and regional  EBL:     BLOOD ADMINISTERED:none  DRAINS: none   LOCAL MEDICATIONS USED:  MARCAINE     SPECIMEN:  No Specimen  DISPOSITION OF SPECIMEN:  N/A  COUNTS:  YES  TOURNIQUET:   Total Tourniquet Time Documented: Forearm (Right) - 17 minutes  DICTATION: .Other Dictation: Dictation Number [id number]  PLAN OF CARE: Discharge to home after PACU  PATIENT DISPOSITION:  PACU - hemodynamically stable.

## 2012-06-13 NOTE — Anesthesia Postprocedure Evaluation (Signed)
  Anesthesia Post-op Note  Patient: Alisha Welch  Procedure(s) Performed: Procedure(s) (LRB) with comments: RELEASE TRIGGER FINGER/A-1 PULLEY (Right) - trigger release right thumb   Patient Location: PACU  Anesthesia Type: MAC and Regional  Level of Consciousness: awake  Airway and Oxygen Therapy: Patient Spontanous Breathing  Post-op Pain: mild  Post-op Assessment: Post-op Vital signs reviewed, Patient's Cardiovascular Status Stable, Respiratory Function Stable, Patent Airway, No signs of Nausea or vomiting and Pain level controlled  Post-op Vital Signs: stable  Complications: No apparent anesthesia complications

## 2012-06-13 NOTE — Anesthesia Procedure Notes (Addendum)
Performed by: Zenia Resides D   Procedure Name: MAC Date/Time: 06/13/2012 1:17 PM Performed by: Zenia Resides D Pre-anesthesia Checklist: Patient identified, Timeout performed, Emergency Drugs available, Suction available and Patient being monitored Patient Re-evaluated:Patient Re-evaluated prior to inductionOxygen Delivery Method: Simple face mask Preoxygenation: Pre-oxygenation with 100% oxygen Placement Confirmation: positive ETCO2,  CO2 detector and breath sounds checked- equal and bilateral

## 2012-06-13 NOTE — Op Note (Signed)
Dictated number: [id number]

## 2012-06-16 ENCOUNTER — Encounter (HOSPITAL_BASED_OUTPATIENT_CLINIC_OR_DEPARTMENT_OTHER): Payer: Self-pay | Admitting: Orthopedic Surgery

## 2012-06-16 NOTE — Op Note (Signed)
NAMESTEVEN, VEAZIE NO.:  [id number]  MEDICAL RECORD NO.:  [id number]  LOCATION:                                 FACILITY:  PHYSICIAN:  Cindee Salt, M.D.       DATE OF BIRTH:  1956/07/31  DATE OF PROCEDURE:  06/13/2012 DATE OF DISCHARGE:                              OPERATIVE REPORT   PREOPERATIVE DIAGNOSIS:  Stenosing tenosynovitis, right thumb.  POSTOPERATIVE DIAGNOSIS:  Stenosing tenosynovitis, right thumb.  OPERATION:  Release of A1 pulley, right thumb.  SURGEON:  Cindee Salt, MD  ANESTHESIA:  Forearm-based IV regional with local infiltration.  ANESTHESIOLOGIST:  Bedelia Person, MD  HISTORY:  The patient is a 56 year old female with history of triggering of her right thumb, this has not responded to conservative treatment. She has elected to undergo surgical release.  Pre, peri, and postoperative course had been discussed along with risks and complications.  She is aware that there is no guarantee with surgery; possibility of infection; recurrence of injury to arteries, nerves, tendons; incomplete relief of symptoms and dystrophy.  In the preoperative area, the patient is seen, the extremity marked by both the patient and surgeon, and antibiotic given.  PROCEDURE:  The patient was brought to the operating room where a forearm-based IV regional anesthetic was carried out without difficulty. She was prepped using ChloraPrep, supine position, right arm free.  A 3- minute dry time was allowed, time-out taken, confirming the patient and procedure.  After adequate anesthesia was afforded, transverse incision was made over the A1 pulley of the right thumb, carried down through the subcutaneous tissue.  Bleeders were electrocauterized with bipolar. Neurovascular structure was identified and protected.  The A1 pulley was identified.  This was released on its radial aspect.  The oblique pulley was left intact.  A thumb was placed through a full range motion,  no further triggering was noted.  The wound was irrigated.  The skin was then closed with interrupted 4-0 Vicryl Rapide sutures.  Local infiltration with 0.25% Marcaine without epinephrine was given, approximately 3-4 mL was used.  Sterile compressive dressing with the thumb free was applied.  On deflation of the tourniquet, all fingers were immediately pinked.  She was taken to the recovery room for observation in satisfactory condition.  She will be discharged on Vicodin.         ______________________________ Cindee Salt, M.D.    GK/MEDQ  D:  06/13/2012  T:  06/14/2012  Job:  [id number]

## 2012-06-23 ENCOUNTER — Encounter: Payer: Self-pay | Admitting: *Deleted

## 2012-06-23 DIAGNOSIS — M659 Synovitis and tenosynovitis, unspecified: Secondary | ICD-10-CM

## 2012-06-23 DIAGNOSIS — M65949 Unspecified synovitis and tenosynovitis, unspecified hand: Secondary | ICD-10-CM

## 2013-02-24 ENCOUNTER — Other Ambulatory Visit: Payer: Self-pay | Admitting: Physician Assistant

## 2013-04-06 ENCOUNTER — Ambulatory Visit (INDEPENDENT_AMBULATORY_CARE_PROVIDER_SITE_OTHER): Payer: BC Managed Care – PPO | Admitting: Physician Assistant

## 2013-04-06 VITALS — BP 130/82 | HR 68 | Temp 98.3°F | Resp 18 | Ht 65.0 in | Wt 194.0 lb

## 2013-04-06 DIAGNOSIS — Z131 Encounter for screening for diabetes mellitus: Secondary | ICD-10-CM

## 2013-04-06 DIAGNOSIS — E039 Hypothyroidism, unspecified: Secondary | ICD-10-CM

## 2013-04-06 LAB — COMPREHENSIVE METABOLIC PANEL
BUN: 13 mg/dL (ref 6–23)
CO2: 25 mEq/L (ref 19–32)
Creat: 0.67 mg/dL (ref 0.50–1.10)
Glucose, Bld: 86 mg/dL (ref 70–99)
Sodium: 140 mEq/L (ref 135–145)
Total Bilirubin: 0.6 mg/dL (ref 0.3–1.2)
Total Protein: 6.8 g/dL (ref 6.0–8.3)

## 2013-04-06 NOTE — Progress Notes (Signed)
Subjective:    Patient ID: Alisha Welch, female    DOB: 27-Apr-1956, 57 y.o.   MRN: [id number]  HPI   Alisha Welch is a very pleasant 57 yr old female here for refill of synthroid.  States she has been on thyroid medication for 11-12 yrs.  Has been on many different doses, feels like the dose changes about every year.  On chart review it looks like dose was decreased about 1 yr ago.  States she is feeling ok today, denies palpitations, sweating, cold intolerance, constipation, dry skin, fatigue.  Does endorse an occasional feeling of lump in her throat which she states has been evaluated by ultrasound in the past and reports everything was normal.  Pt does admit to difficulty taking her medication consistently.  Knows it is best to take in the AM on an empty stomach but has trouble doing this.  States that her last CPE was "awhile ago".  Plans to set up an appt for mammo today.  Knows she needs CPE.  She is concerned about weight gain.  Feels like weight gain started when she began synthroid 11-12 yrs ago.  Lost about 13 pounds last year but then unfortunately gained it all back and then some.  Last year was exercising more and had cut out sugar.  Now states she is exercising less frequently and is less strict about her diet.  Has started walking recently, going about 3 times per week.  States she was previously doing 2.5 miles per day, but is not walking that far now.  States she usually eat three meals per day.  Knows she doesn't get enough water.  Tries to limit sodas, but does enjoy sweet tea.  Admits that breads and sugars tend to be her downfall.  Knows that there is room for improvement in her lifestyle.  States that blood glucose was up slightly last time she had it checked which was concerning to her.   Review of Systems  Constitutional: Negative for fever, chills, diaphoresis and fatigue.  HENT: Negative.   Respiratory: Negative.   Cardiovascular: Negative.   Gastrointestinal: Negative.    Endocrine: Negative for cold intolerance and heat intolerance.  Genitourinary: Negative.   Musculoskeletal: Negative.   Skin: Negative.   Neurological: Negative.        Objective:   Physical Exam  Vitals reviewed. Constitutional: She is oriented to person, place, and time. She appears well-developed and well-nourished. No distress.  HENT:  Head: Normocephalic and atraumatic.  Eyes: Conjunctivae are normal. No scleral icterus.  Cardiovascular: Normal rate, regular rhythm and normal heart sounds.   Pulmonary/Chest: Effort normal and breath sounds normal. She has no wheezes. She has no rales.  Abdominal: Soft. There is no tenderness.  Neurological: She is alert and oriented to person, place, and time.  Skin: Skin is warm and dry.  Psychiatric: She has a normal mood and affect. Her behavior is normal.    Results for orders placed in visit on 04/06/13  POCT GLYCOSYLATED HEMOGLOBIN (HGB A1C)      Result Value Range   Hemoglobin A1C 5.5         Assessment & Plan:  Hypothyroidism - Plan: Comprehensive metabolic panel, TSH  Screening for diabetes mellitus (DM) - Plan: POCT glycosylated hemoglobin (Hb A1C)   Alisha Welch is a very pleasant 57 yr old female with hypothyroidism.  Needs refills.  Feels well today, exam is normal.  TSH pending - await results and then will refill medication/adjust if necessary.  Discussed lifestyle modifications for weight loss with patient.  She appears to be beginning to make changes and appears motivated.  Encouraged pt to keep a food diary for 5-7 days so we can get an accurate picture of the calories she is consuming.  Encouraged her to begin increasing duration and intensity of exercise.  Encouraged 150 minutes per week of moderate to vigorous exercise (able to talk in short phrases but not sing while exercising).  Offered to refer pt to nutrition for more detailed help with meal planning and healthy dietary choices.  Pt will talk over with her husband  and let me know if she would like to pursue this.  I am happy to send referral.

## 2013-04-06 NOTE — Patient Instructions (Addendum)
Begin keeping a food diary for the next 5-7 days so we can get an accurate picture of the calories you are taking in each day.    Increase water intake.  Try to decrease sugar-sweetened beverages like soda, sweet tea, juice, energy drinks, sports drinks, etc  Begin increasing the intensity and duration of your walks.  We want to get to 150 minutes per week of moderate to vigorous intensity exercise.  Moderate intensity allows you to talk in short phrases but not sing.  Think about if you would like to meet with a nutritionist to talk further about the calories your body requires and how to best nourish your body  I will let you know when I have labs back and if we need to change your synthroid dose

## 2013-04-07 ENCOUNTER — Telehealth: Payer: Self-pay | Admitting: Physician Assistant

## 2013-04-07 LAB — TSH: TSH: 0.954 u[IU]/mL (ref 0.350–4.500)

## 2013-04-07 MED ORDER — LEVOTHYROXINE SODIUM 112 MCG PO TABS: 112.0000 ug | ORAL_TABLET | Freq: Every day | ORAL | Status: DC

## 2013-04-07 NOTE — Telephone Encounter (Signed)
Spoke with pt and advised of normal lab results.  Will keep synthroid at current dose - I have sent this to the pharmacy.  Pt reports she has started a food diary and exercised last night, feels very motivated to make lifestyle changes to promote weight loss

## 2013-12-16 ENCOUNTER — Other Ambulatory Visit: Payer: Self-pay

## 2013-12-16 DIAGNOSIS — Z1231 Encounter for screening mammogram for malignant neoplasm of breast: Secondary | ICD-10-CM

## 2013-12-30 ENCOUNTER — Ambulatory Visit: Payer: BC Managed Care – PPO

## 2013-12-30 ENCOUNTER — Other Ambulatory Visit: Payer: Self-pay

## 2013-12-30 ENCOUNTER — Ambulatory Visit
Admission: RE | Admit: 2013-12-30 | Discharge: 2013-12-30 | Disposition: A | Discharge status: Home / Self Care | Payer: Self-pay | Source: Ambulatory Visit

## 2013-12-30 DIAGNOSIS — Z1231 Encounter for screening mammogram for malignant neoplasm of breast: Secondary | ICD-10-CM

## 2014-01-04 ENCOUNTER — Other Ambulatory Visit: Payer: Self-pay | Admitting: Obstetrics & Gynecology

## 2014-01-04 DIAGNOSIS — R928 Other abnormal and inconclusive findings on diagnostic imaging of breast: Secondary | ICD-10-CM

## 2014-01-13 ENCOUNTER — Ambulatory Visit
Admission: RE | Admit: 2014-01-13 | Discharge: 2014-01-13 | Disposition: A | Discharge status: Home / Self Care | Payer: Self-pay | Source: Ambulatory Visit | Attending: Obstetrics & Gynecology | Admitting: Obstetrics & Gynecology

## 2014-01-13 ENCOUNTER — Ambulatory Visit
Admission: RE | Admit: 2014-01-13 | Discharge: 2014-01-13 | Disposition: A | Discharge status: Home / Self Care | Payer: BC Managed Care – PPO | Source: Ambulatory Visit | Attending: Obstetrics & Gynecology | Admitting: Obstetrics & Gynecology

## 2014-01-13 DIAGNOSIS — R928 Other abnormal and inconclusive findings on diagnostic imaging of breast: Secondary | ICD-10-CM

## 2014-04-13 ENCOUNTER — Other Ambulatory Visit: Payer: Self-pay | Admitting: Physician Assistant

## 2014-05-21 ENCOUNTER — Ambulatory Visit (INDEPENDENT_AMBULATORY_CARE_PROVIDER_SITE_OTHER): Payer: BC Managed Care – PPO | Admitting: Family Medicine

## 2014-05-21 VITALS — BP 130/76 | HR 58 | Temp 97.9°F | Resp 16 | Ht 65.25 in | Wt 192.6 lb

## 2014-05-21 DIAGNOSIS — E038 Other specified hypothyroidism: Secondary | ICD-10-CM

## 2014-05-21 MED ORDER — LEVOTHYROXINE SODIUM 112 MCG PO TABS: ORAL_TABLET | ORAL | Status: DC

## 2014-05-21 NOTE — Progress Notes (Addendum)
[facility] [address] [place] [address] [phone]  Date:  05/21/2014   Name:  Alisha Welch   DOB:  [birth year]   MRN:  [id number]  PCP:  No PCP Per Patient    Chief Complaint: Medication Refill   History of Present Illness:  Alisha Welch is a 58 y.o. very pleasant female patient who presents with the following:  Needs to follow-up her TSH today.  Her last TSH was over a year ago- will do this for her today. She took her last pill yesterday.   She has ben on her current dose for a good amount of time. No other complaints today, she feels as though her level is ok  Patient Active Problem List   Diagnosis Date Noted  . Tenosynovitis of thumb 05/27/2012    Past Medical History  Diagnosis Date  . Hypothyroid     Past Surgical History  Procedure Laterality Date  . Breast biopsy    . Abdominal hysterectomy    . Trigger finger release  06/13/2012    Procedure: RELEASE TRIGGER FINGER/A-1 PULLEY;  Surgeon: Wynonia Sours, MD;  Location: [place];  Service: Orthopedics;  Laterality: Right;  trigger release right thumb     History  Substance Use Topics  . Smoking status: Never Smoker   . Smokeless tobacco: Not on file  . Alcohol Use: Yes     Comment: occassionally    Family History  Problem Relation Age of Onset  . Stroke Mother   . Cancer Father     No Known Allergies  Medication list has been reviewed and updated.  Current Outpatient Prescriptions on File Prior to Visit  Medication Sig Dispense Refill  . levothyroxine (SYNTHROID, LEVOTHROID) 112 MCG tablet Take 1 tablet daily with breakfast PT NEED APPT FOR FURTHER REFILLS  30 tablet  0   No current facility-administered medications on file prior to visit.    Review of Systems:  As per HPI- otherwise negative.   Physical Examination: Filed Vitals:   05/21/14 1114  BP: 130/76  Pulse: 58  Temp: 97.9 F (36.6 C)  Resp: 16   Filed Vitals:   05/21/14 1114   Height: 5' 5.25" (1.657 m)  Weight: 192 lb 9.6 oz (87.363 kg)   Body mass index is 31.82 kg/(m^2). Ideal Body Weight: Weight in (lb) to have BMI = 25: 151.1  GEN: WDWN, NAD, Non-toxic, A & O x 3, obese, looks well HEENT: Atraumatic, Normocephalic. Neck supple. No masses, No LAD.  Thyroid exam wnl Ears and Nose: No external deformity. CV: RRR, No M/G/R. No JVD. No thrill. No extra heart sounds. PULM: CTA B, no wheezes, crackles, rhonchi. No retractions. No resp. distress. No accessory muscle use. ABD: S, NT, ND, +BS. No rebound. No HSM. EXTR: No c/c/e NEURO Normal gait.  PSYCH: Normally interactive. Conversant. Not depressed or anxious appearing.  Calm demeanor.    Assessment and Plan: Other specified hypothyroidism - Plan: levothyroxine (SYNTHROID, LEVOTHROID) 112 MCG tablet, TSH  Refilled her synthroid today. Check tsh and follow-up pending results.    Signed Lamar Blinks, MD  Received her TSH 8/15:  Results for orders placed in visit on 05/21/14  TSH      Result Value Ref Range   TSH 0.326 (*) 0.350 - 4.500 uIU/mL   Called and left detailed message. Her TSH is just minimally suppressed.  Would be reasonable to adjust her dose or have her come in for  a lab visit only in 2 or 3 weeks and recheck.  Will send her a letter as well.  Please let me know how she would prefer to proceed

## 2014-05-21 NOTE — Patient Instructions (Signed)
I will be in touch with your labs and we can adjust your medication if needed

## 2014-05-22 ENCOUNTER — Encounter: Payer: Self-pay | Admitting: Family Medicine

## 2014-05-22 LAB — TSH: TSH: 0.326 u[IU]/mL — ABNORMAL LOW (ref 0.350–4.500)

## 2014-05-22 NOTE — Addendum Note (Signed)
Addended by: Lamar Blinks C on: 05/22/2014 01:55 PM   Modules accepted: Orders

## 2015-05-30 ENCOUNTER — Encounter: Payer: Self-pay | Admitting: Family Medicine

## 2015-07-29 ENCOUNTER — Other Ambulatory Visit: Payer: Self-pay | Admitting: Family Medicine

## 2015-09-08 ENCOUNTER — Ambulatory Visit (INDEPENDENT_AMBULATORY_CARE_PROVIDER_SITE_OTHER): Payer: BC Managed Care – PPO | Admitting: Physician Assistant

## 2015-09-08 VITALS — BP 118/82 | HR 82 | Temp 98.6°F | Resp 18 | Ht 65.25 in | Wt 188.4 lb

## 2015-09-08 DIAGNOSIS — K648 Other hemorrhoids: Secondary | ICD-10-CM | POA: Diagnosis not present

## 2015-09-08 DIAGNOSIS — Z1211 Encounter for screening for malignant neoplasm of colon: Secondary | ICD-10-CM | POA: Diagnosis not present

## 2015-09-08 DIAGNOSIS — K644 Residual hemorrhoidal skin tags: Secondary | ICD-10-CM

## 2015-09-08 NOTE — Progress Notes (Signed)
[facility] [address] [place] [address] [phone]  Date:  09/08/2015   Name:  Alisha Welch   DOB:  03-26-1956   MRN:  [id number]  PCP:  No PCP Per Patient    History of Present Illness:  Alisha Welch is a 59 y.o. female patient who presents to [facility] for cc of lump at anus that became apparent 1 week ago, while she was on the toilet.  The next day, it was pruritic.  There is not pain that she can describe, but discomfort.  She denies a hx of constipation.  She feels bloated and gassy.  Drinks very little water.  Due to the hemorrhoids, she has used a hemorrhoid cream and tucks wipes.  She has used stool softeners.  States that she has bm every other day.  No fever.    No colonoscopy screening at this time.   Patient Active Problem List   Diagnosis Date Noted  . Tenosynovitis of thumb 05/27/2012    Past Medical History  Diagnosis Date  . Hypothyroid     Past Surgical History  Procedure Laterality Date  . Breast biopsy    . Abdominal hysterectomy    . Trigger finger release  06/13/2012    Procedure: RELEASE TRIGGER FINGER/A-1 PULLEY;  Surgeon: Wynonia Sours, MD;  Location: [place];  Service: Orthopedics;  Laterality: Right;  trigger release right thumb     Social History  Substance Use Topics  . Smoking status: Never Smoker   . Smokeless tobacco: None  . Alcohol Use: Yes     Comment: occassionally    Family History  Problem Relation Age of Onset  . Stroke Mother   . Cancer Father     No Known Allergies  Medication list has been reviewed and updated.  Current Outpatient Prescriptions on File Prior to Visit  Medication Sig Dispense Refill  . levothyroxine (SYNTHROID, LEVOTHROID) 112 MCG tablet TAKE ONE TABLET BY MOUTH EVERY MORNING WITH BREAKFAST.  "NO MORE REFILLS WITHOUT OFFICE VISIT" 90 tablet 0   No current facility-administered medications on file prior to visit.    ROS ROS otherwise unremarkable unless  listed above.  Physical Examination: BP 118/82 mmHg  Pulse 82  Temp(Src) 98.6 F (37 C) (Oral)  Resp 18  Ht 5' 5.25" (1.657 m)  Wt 188 lb 6.4 oz (85.458 kg)  BMI 31.12 kg/m2  SpO2 97% Ideal Body Weight: Weight in (lb) to have BMI = 25: 151.1  Physical Exam  Constitutional: She is oriented to person, place, and time. She appears well-developed and well-nourished. No distress.  HENT:  Head: Normocephalic and atraumatic.  Right Ear: External ear normal.  Left Ear: External ear normal.  Eyes: Conjunctivae and EOM are normal. Pupils are equal, round, and reactive to light.  Cardiovascular: Normal rate.   Pulmonary/Chest: Effort normal. No respiratory distress.  Genitourinary: Rectal exam shows external hemorrhoid (posterior 12 o;clock thrombosed hemorrhoid) and tenderness. Rectal exam shows no fissure.  Neurological: She is alert and oriented to person, place, and time.  Skin: She is not diaphoretic.  Psychiatric: She has a normal mood and affect. Her behavior is normal.   Procedure: Verbal consent obtained. Swabbed with alcohol pad.  1% Lidocaine with 5 cc injected at hemorrhoid and base.  Swabbed with povidine.  11 blade used to open hemorrhoid, with multiple clots expressed.  Hemostats applied to search for clots.  Cleansed with normal saline.     Assessment  and Plan: Alisha Welch is a 59 y.o. female who is here today for lump at anus.  Hemorrhoid excised without complication.  After care discussed at length.  Alarming sxs discussed to warrant immediate return.   There are no diagnoses linked to this encounter.  Referral sent for colonoscopy screening.   Hemorrhoids, external - Plan: Ambulatory referral to Gastroenterology  Screening for colon cancer - Plan: Ambulatory referral to Gastroenterology   Ivar Drape, PA-C [facility] 09/08/2015 4:15 PM

## 2015-09-08 NOTE — Patient Instructions (Signed)
Take warm sitz baths 3-4 times per day.   You can take ibuprofen or tylenol for the pain.   I am referring you to gastroenterologist, please await appointment phone call.   Hemorrhoids Hemorrhoids are swollen veins around the rectum or anus. There are two types of hemorrhoids:   Internal hemorrhoids. These occur in the veins just inside the rectum. They may poke through to the outside and become irritated and painful.  External hemorrhoids. These occur in the veins outside the anus and can be felt as a painful swelling or hard lump near the anus. CAUSES  Pregnancy.   Obesity.   Constipation or diarrhea.   Straining to have a bowel movement.   Sitting for long periods on the toilet.  Heavy lifting or other activity that caused you to strain.  Anal intercourse. SYMPTOMS   Pain.   Anal itching or irritation.   Rectal bleeding.   Fecal leakage.   Anal swelling.   One or more lumps around the anus.  DIAGNOSIS  Your caregiver may be able to diagnose hemorrhoids by visual examination. Other examinations or tests that may be performed include:   Examination of the rectal area with a gloved hand (digital rectal exam).   Examination of anal canal using a small tube (scope).   A blood test if you have lost a significant amount of blood.  A test to look inside the colon (sigmoidoscopy or colonoscopy). TREATMENT Most hemorrhoids can be treated at home. However, if symptoms do not seem to be getting better or if you have a lot of rectal bleeding, your caregiver may perform a procedure to help make the hemorrhoids get smaller or remove them completely. Possible treatments include:   Placing a rubber band at the base of the hemorrhoid to cut off the circulation (rubber band ligation).   Injecting a chemical to shrink the hemorrhoid (sclerotherapy).   Using a tool to burn the hemorrhoid (infrared light therapy).   Surgically removing the hemorrhoid  (hemorrhoidectomy).   Stapling the hemorrhoid to block blood flow to the tissue (hemorrhoid stapling).  HOME CARE INSTRUCTIONS   Eat foods with fiber, such as whole grains, beans, nuts, fruits, and vegetables. Ask your doctor about taking products with added fiber in them (fibersupplements).  Increase fluid intake. Drink enough water and fluids to keep your urine clear or pale yellow.   Exercise regularly.   Go to the bathroom when you have the urge to have a bowel movement. Do not wait.   Avoid straining to have bowel movements.   Keep the anal area dry and clean. Use wet toilet paper or moist towelettes after a bowel movement.   Medicated creams and suppositories may be used or applied as directed.   Only take over-the-counter or prescription medicines as directed by your caregiver.   Take warm sitz baths for 15-20 minutes, 3-4 times a day to ease pain and discomfort.   Place ice packs on the hemorrhoids if they are tender and swollen. Using ice packs between sitz baths may be helpful.   Put ice in a plastic bag.   Place a towel between your skin and the bag.   Leave the ice on for 15-20 minutes, 3-4 times a day.   Do not use a donut-shaped pillow or sit on the toilet for long periods. This increases blood pooling and pain.  SEEK MEDICAL CARE IF:  You have increasing pain and swelling that is not controlled by treatment or medicine.  You have  uncontrolled bleeding.  You have difficulty or you are unable to have a bowel movement.  You have pain or inflammation outside the area of the hemorrhoids. MAKE SURE YOU:  Understand these instructions.  Will watch your condition.  Will get help right away if you are not doing well or get worse.   This information is not intended to replace advice given to you by your health care provider. Make sure you discuss any questions you have with your health care provider.   Document Released: 09/21/2000 Document  Revised: 09/10/2012 Document Reviewed: 07/29/2012 Elsevier Interactive Patient Education [employer].

## 2015-10-28 ENCOUNTER — Encounter (HOSPITAL_BASED_OUTPATIENT_CLINIC_OR_DEPARTMENT_OTHER): Payer: Self-pay | Admitting: Emergency Medicine

## 2015-10-28 ENCOUNTER — Emergency Department (HOSPITAL_BASED_OUTPATIENT_CLINIC_OR_DEPARTMENT_OTHER)
Admission: EM | Admit: 2015-10-28 | Discharge: 2015-10-28 | Disposition: A | Discharge status: Home / Self Care | Payer: Worker's Compensation | Attending: Emergency Medicine | Admitting: Emergency Medicine

## 2015-10-28 DIAGNOSIS — Y92218 Other school as the place of occurrence of the external cause: Secondary | ICD-10-CM | POA: Diagnosis not present

## 2015-10-28 DIAGNOSIS — Y998 Other external cause status: Secondary | ICD-10-CM | POA: Diagnosis not present

## 2015-10-28 DIAGNOSIS — Y9389 Activity, other specified: Secondary | ICD-10-CM | POA: Diagnosis not present

## 2015-10-28 DIAGNOSIS — E039 Hypothyroidism, unspecified: Secondary | ICD-10-CM | POA: Diagnosis not present

## 2015-10-28 DIAGNOSIS — S21131A Puncture wound without foreign body of right front wall of thorax without penetration into thoracic cavity, initial encounter: Secondary | ICD-10-CM | POA: Diagnosis not present

## 2015-10-28 DIAGNOSIS — W503XXA Accidental bite by another person, initial encounter: Secondary | ICD-10-CM | POA: Insufficient documentation

## 2015-10-28 DIAGNOSIS — S21151A Open bite of right front wall of thorax without penetration into thoracic cavity, initial encounter: Secondary | ICD-10-CM | POA: Diagnosis present

## 2015-10-28 DIAGNOSIS — Z23 Encounter for immunization: Secondary | ICD-10-CM | POA: Insufficient documentation

## 2015-10-28 MED ORDER — AMOXICILLIN-POT CLAVULANATE 875-125 MG PO TABS: 1.0000 | ORAL_TABLET | Freq: Two times a day (BID) | ORAL | Status: DC

## 2015-10-28 MED ORDER — TETANUS-DIPHTH-ACELL PERTUSSIS 5-2.5-18.5 LF-MCG/0.5 IM SUSP
0.5000 mL | Freq: Once | INTRAMUSCULAR | Status: AC
  Administered 2015-10-28: 0.5 mL via INTRAMUSCULAR
  Filled 2015-10-28: qty 0.5
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
## 2015-10-28 NOTE — Discharge Instructions (Signed)

## 2015-11-20 ENCOUNTER — Other Ambulatory Visit: Payer: Self-pay | Admitting: Family Medicine

## 2016-02-13 ENCOUNTER — Other Ambulatory Visit: Payer: Self-pay

## 2016-02-13 DIAGNOSIS — Z1231 Encounter for screening mammogram for malignant neoplasm of breast: Secondary | ICD-10-CM

## 2016-03-01 ENCOUNTER — Ambulatory Visit
Admission: RE | Admit: 2016-03-01 | Discharge: 2016-03-01 | Disposition: A | Discharge status: Home / Self Care | Payer: BC Managed Care – PPO | Source: Ambulatory Visit

## 2016-03-01 DIAGNOSIS — Z1231 Encounter for screening mammogram for malignant neoplasm of breast: Secondary | ICD-10-CM

## 2016-03-03 ENCOUNTER — Ambulatory Visit (INDEPENDENT_AMBULATORY_CARE_PROVIDER_SITE_OTHER): Payer: BC Managed Care – PPO | Admitting: Physician Assistant

## 2016-03-03 VITALS — BP 120/80 | HR 77 | Temp 97.8°F | Resp 18 | Ht 65.0 in | Wt 197.1 lb

## 2016-03-03 DIAGNOSIS — J019 Acute sinusitis, unspecified: Secondary | ICD-10-CM

## 2016-03-03 DIAGNOSIS — E038 Other specified hypothyroidism: Secondary | ICD-10-CM | POA: Diagnosis not present

## 2016-03-03 MED ORDER — GUAIFENESIN ER 1200 MG PO TB12: 1.0000 | ORAL_TABLET | Freq: Two times a day (BID) | ORAL | Status: DC | PRN

## 2016-03-03 MED ORDER — AMOXICILLIN-POT CLAVULANATE 875-125 MG PO TABS: 1.0000 | ORAL_TABLET | Freq: Two times a day (BID) | ORAL | Status: AC

## 2016-03-03 MED ORDER — CETIRIZINE HCL 10 MG PO TABS: 10.0000 mg | ORAL_TABLET | Freq: Every day | ORAL | Status: DC

## 2016-03-03 MED ORDER — LEVOTHYROXINE SODIUM 112 MCG PO TABS: ORAL_TABLET | ORAL | Status: DC

## 2016-03-03 MED ORDER — AMOXICILLIN-POT CLAVULANATE 875-125 MG PO TABS: 1.0000 | ORAL_TABLET | Freq: Two times a day (BID) | ORAL | Status: DC

## 2016-03-03 MED ORDER — FLUTICASONE PROPIONATE 50 MCG/ACT NA SUSP: 2.0000 | Freq: Every day | NASAL | Status: DC

## 2016-03-03 NOTE — Progress Notes (Signed)
[facility] [address] [place] [address] [phone]  Date:  03/03/2016   Name:  Alisha Welch   DOB:  Apr 11, 1956   MRN:  [id number]  PCP:  No PCP Per Patient   Chief Complaint  Patient presents with  . Allergies    left eye draining & gooey, sinus irritation, cough, drainage, & headache  . Medication Refill    thyroid medication (has one pill left)    History of Present Illness:  Alisha Welch is a 60 y.o. female patient who presents to [facility] for cc of allergies and medication refill.  She has had nasal congestion, and sinus pressure for the last 2 weeks.  The mucus is thick and colored.  She is coughing moreso at night.  She has had no nose bleeds.  No fever.  Pain is around the frontal sinuses.  There is also new finding of eye watering over the last few days.  She had some discharge yesterday that was white and in corners of eye.  It is not pruritic or painful, but feels mildly irritated.  She woke up this morning with her eye matted shut.  It is now just constantly watering.  She has some allergies to the outdoors.  She has been attempting mucinex, and sudafed at this time.  She does not take any allergy medications.  She also states that she has another cat in the house for 1 month.  She has 2 other cats in the home for about a year.  She has never been allergy tested.   She would also like refill of thyroid medication.  She is taking very rarely, due to being out of medication for about 4 months.  She has had no nausea, fever, cp, palpitations, sob, constipation, or skin changes.  She does have constipation, however this is resolving with mucinex. .     Patient Active Problem List   Diagnosis Date Noted  . Tenosynovitis of thumb 05/27/2012    Past Medical History  Diagnosis Date  . Hypothyroid     Past Surgical History  Procedure Laterality Date  . Breast biopsy    . Abdominal hysterectomy    . Trigger finger release  06/13/2012    Procedure:  RELEASE TRIGGER FINGER/A-1 PULLEY;  Surgeon: Wynonia Sours, MD;  Location: [facility];  Service: Orthopedics;  Laterality: Right;  trigger release right thumb     Social History  Substance Use Topics  . Smoking status: Never Smoker   . Smokeless tobacco: None  . Alcohol Use: Yes     Comment: occassionally    Family History  Problem Relation Age of Onset  . Stroke Mother   . Cancer Father     No Known Allergies  Medication list has been reviewed and updated.  No current outpatient prescriptions on file prior to visit.   No current facility-administered medications on file prior to visit.    ROS ROS otherwise unremarkable unless listed above.   Physical Examination: BP 120/80 mmHg  Pulse 77  Temp(Src) 97.8 F (36.6 C) (Oral)  Resp 18  Ht 5\' 5"  (1.651 m)  Wt 197 lb 2 oz (89.415 kg)  BMI 32.80 kg/m2  SpO2 100% Ideal Body Weight: Weight in (lb) to have BMI = 25: 149.9  Physical Exam  Constitutional: She is oriented to person, place, and time. She appears well-developed and well-nourished. No distress.  HENT:  Head: Normocephalic and atraumatic.  Right Ear: Tympanic  membrane, external ear and ear canal normal. Tympanic membrane is not injected, not erythematous, not retracted and not bulging.  Left Ear: Tympanic membrane, external ear and ear canal normal. Tympanic membrane is not injected, not erythematous, not retracted and not bulging.  Nose: Mucosal edema (cyanotic) and rhinorrhea present. Right sinus exhibits maxillary sinus tenderness. Right sinus exhibits no frontal sinus tenderness. Left sinus exhibits maxillary sinus tenderness. Left sinus exhibits no frontal sinus tenderness.  Mouth/Throat: No uvula swelling. No oropharyngeal exudate, posterior oropharyngeal edema or posterior oropharyngeal erythema.  Eyes: Conjunctivae and EOM are normal. Pupils are equal, round, and reactive to light.  Cardiovascular: Normal rate and regular rhythm.  Exam reveals  no gallop, no distant heart sounds and no friction rub.   No murmur heard. Pulmonary/Chest: Effort normal. No respiratory distress. She has no decreased breath sounds. She has no wheezes. She has no rhonchi.  Lymphadenopathy:       Head (right side): No submandibular, no tonsillar, no preauricular and no posterior auricular adenopathy present.       Head (left side): No submandibular, no tonsillar, no preauricular and no posterior auricular adenopathy present.  Neurological: She is alert and oriented to person, place, and time.  Skin: She is not diaphoretic.  Psychiatric: She has a normal mood and affect. Her behavior is normal.     Assessment and Plan: Alisha Welch is a 60 y.o. female who is here today with congestion, sinus pressure, and eye watering. I think the eye symptoms are likely allergic etiology.  We will go ahead and treat with abx with longstanding symptoms.  I have also started her on zyrtec as well.   Refilling the synthroid.  Will place future lab for her to return to obtain tsh, in 6-8 weeks, after thyroid medication is optimal.    Subacute sinusitis, unspecified location - Plan: cetirizine (ZYRTEC) 10 MG tablet, Guaifenesin (MUCINEX MAXIMUM STRENGTH) 1200 MG TB12, amoxicillin-clavulanate (AUGMENTIN) 875-125 MG tablet, fluticasone (FLONASE) 50 MCG/ACT nasal spray, DISCONTINUED: amoxicillin-clavulanate (AUGMENTIN) 875-125 MG tablet  Other specified hypothyroidism - Plan: levothyroxine (SYNTHROID, LEVOTHROID) 112 MCG tablet, TSH, CANCELED: TSH   Ivar Drape, PA-C [facility] 03/03/2016 5:52 PM

## 2016-03-03 NOTE — Patient Instructions (Addendum)
IF you received an x-ray today, you will receive an invoice from [facility]. Please contact [facility] at [phone] with questions or concerns regarding your invoice.   IF you received labwork today, you will receive an invoice from [employer]. Please contact [facility] at [phone] with questions or concerns regarding your invoice.   Our billing staff will not be able to assist you with questions regarding bills from these companies.  You will be contacted with the lab results as soon as they are available. The fastest way to get your results is to activate your My Chart account. Instructions are located on the last page of this paperwork. If you have not heard from [place] regarding the results in 2 weeks, please contact this office.    I would like you to do over-the-counter anti-histamine eye drops.   Please take medication as prescribed.   Please use the cetirizine daily throughout the season.   Try to get rid of triggers that may start your symptoms.  Please hydrate well with 64 oz or more of water.  Please return 6-8 weeks for a LABS ONLY visit to get the thyroid bloodwork.  Sinusitis, Adult Sinusitis is redness, soreness, and inflammation of the paranasal sinuses. Paranasal sinuses are air pockets within the bones of your face. They are located beneath your eyes, in the middle of your forehead, and above your eyes. In healthy paranasal sinuses, mucus is able to drain out, and air is able to circulate through them by way of your nose. However, when your paranasal sinuses are inflamed, mucus and air can become trapped. This can allow bacteria and other germs to grow and cause infection. Sinusitis can develop quickly and last only a short time (acute) or continue over a long period (chronic). Sinusitis that lasts for more than 12 weeks is considered chronic. CAUSES Causes of sinusitis include:  Allergies.  Structural abnormalities,  such as displacement of the cartilage that separates your nostrils (deviated septum), which can decrease the air flow through your nose and sinuses and affect sinus drainage.  Functional abnormalities, such as when the small hairs (cilia) that line your sinuses and help remove mucus do not work properly or are not present. SIGNS AND SYMPTOMS Symptoms of acute and chronic sinusitis are the same. The primary symptoms are pain and pressure around the affected sinuses. Other symptoms include:  Upper toothache.  Earache.  Headache.  Bad breath.  Decreased sense of smell and taste.  A cough, which worsens when you are lying flat.  Fatigue.  Fever.  Thick drainage from your nose, which often is green and may contain pus (purulent).  Swelling and warmth over the affected sinuses. DIAGNOSIS Your health care provider will perform a physical exam. During your exam, your health care provider may perform any of the following to help determine if you have acute sinusitis or chronic sinusitis:  Look in your nose for signs of abnormal growths in your nostrils (nasal polyps).  Tap over the affected sinus to check for signs of infection.  View the inside of your sinuses using an imaging device that has a light attached (endoscope). If your health care provider suspects that you have chronic sinusitis, one or more of the following tests may be recommended:  Allergy tests.  Nasal culture. A sample of mucus is taken from your nose, sent to a lab, and screened for bacteria.  Nasal cytology. A sample of mucus is taken from your nose and examined  by your health care provider to determine if your sinusitis is related to an allergy. TREATMENT Most cases of acute sinusitis are related to a viral infection and will resolve on their own within 10 days. Sometimes, medicines are prescribed to help relieve symptoms of both acute and chronic sinusitis. These may include pain medicines, decongestants, nasal  steroid sprays, or saline sprays. However, for sinusitis related to a bacterial infection, your health care provider will prescribe antibiotic medicines. These are medicines that will help kill the bacteria causing the infection. Rarely, sinusitis is caused by a fungal infection. In these cases, your health care provider will prescribe antifungal medicine. For some cases of chronic sinusitis, surgery is needed. Generally, these are cases in which sinusitis recurs more than 3 times per year, despite other treatments. HOME CARE INSTRUCTIONS  Drink plenty of water. Water helps thin the mucus so your sinuses can drain more easily.  Use a humidifier.  Inhale steam 3-4 times a day (for example, sit in the bathroom with the shower running).  Apply a warm, moist washcloth to your face 3-4 times a day, or as directed by your health care provider.  Use saline nasal sprays to help moisten and clean your sinuses.  Take medicines only as directed by your health care provider.  If you were prescribed either an antibiotic or antifungal medicine, finish it all even if you start to feel better. SEEK IMMEDIATE MEDICAL CARE IF:  You have increasing pain or severe headaches.  You have nausea, vomiting, or drowsiness.  You have swelling around your face.  You have vision problems.  You have a stiff neck.  You have difficulty breathing.   This information is not intended to replace advice given to you by your health care provider. Make sure you discuss any questions you have with your health care provider.   Document Released: 09/24/2005 Document Revised: 10/15/2014 Document Reviewed: 10/09/2011 Elsevier Interactive Patient Education [employer].

## 2016-04-20 ENCOUNTER — Ambulatory Visit (INDEPENDENT_AMBULATORY_CARE_PROVIDER_SITE_OTHER): Payer: BC Managed Care – PPO | Admitting: Physician Assistant

## 2016-04-20 DIAGNOSIS — Z889 Allergy status to unspecified drugs, medicaments and biological substances status: Secondary | ICD-10-CM

## 2016-04-20 DIAGNOSIS — E038 Other specified hypothyroidism: Secondary | ICD-10-CM

## 2016-04-20 LAB — TSH: TSH: 0.32 m[IU]/L — AB

## 2016-04-28 ENCOUNTER — Telehealth: Payer: Self-pay

## 2016-04-28 NOTE — Telephone Encounter (Signed)
Patient would also like for Ms. English to refer her to an allergist.   [phone] (H

## 2016-04-28 NOTE — Telephone Encounter (Signed)
Pt called in about her Lab results. Pharmacy called her to verify a refill but patient doesn't want to authorize the refill if Colletta [place] has to change her medication based off of her last lab visit. Spoke with Lamar Blinks about this & she's going to relay the message to [place] and have her call the patient as soon as she reviews the labs.

## 2016-05-01 ENCOUNTER — Other Ambulatory Visit: Payer: Self-pay | Admitting: Physician Assistant

## 2016-05-01 DIAGNOSIS — E039 Hypothyroidism, unspecified: Secondary | ICD-10-CM

## 2016-05-01 MED ORDER — LEVOTHYROXINE SODIUM 100 MCG PO TABS: 100.0000 ug | ORAL_TABLET | Freq: Every day | ORAL | 2 refills | Status: DC

## 2016-05-01 NOTE — Telephone Encounter (Signed)
We will change the levothyroxine to a milder dose.  We will do it for 163mcg.  I will have her return for follow up tsh in 6 weeks.  It appears as if this was too much of a dose.   I will refer her to an allergist.

## 2016-05-04 NOTE — Telephone Encounter (Signed)
Called patient to advise lower dose of Synthroid has been sent and what lab plan is left message for patient

## 2016-05-06 NOTE — Progress Notes (Signed)
Labs only visit--allergy referral from later date requested

## 2016-05-09 NOTE — Telephone Encounter (Signed)
Pt notified and has appt with allergist Sept 1st.

## 2016-05-16 ENCOUNTER — Telehealth: Payer: Self-pay

## 2016-05-16 DIAGNOSIS — J019 Acute sinusitis, unspecified: Secondary | ICD-10-CM

## 2016-05-16 DIAGNOSIS — E039 Hypothyroidism, unspecified: Secondary | ICD-10-CM

## 2016-05-16 MED ORDER — CETIRIZINE HCL 10 MG PO TABS: 10.0000 mg | ORAL_TABLET | Freq: Every day | ORAL | 3 refills | Status: DC

## 2016-05-16 NOTE — Telephone Encounter (Signed)
Pt called from [place] and reported her luggage was lost and she does not have her levothyroxine. She will be without for about a week and a half. I see that she was off of her med for about 4 mos previous to May OV, so advised that she should not experience as many SEs as then, if any. Instructed her to CB if she does have any Sxs that she is concerned about. Sent in replacement Rx of allergy med that was lost, she still has more RFs of levo at pharm.

## 2016-07-06 ENCOUNTER — Encounter: Payer: Self-pay | Admitting: Gastroenterology

## 2016-07-28 ENCOUNTER — Other Ambulatory Visit: Payer: Self-pay

## 2016-07-28 NOTE — Telephone Encounter (Signed)
No notes on follow up. Last labs 04/2016  Please advise on refills.

## 2016-07-30 MED ORDER — LEVOTHYROXINE SODIUM 100 MCG PO TABS: 100.0000 ug | ORAL_TABLET | Freq: Every day | ORAL | 1 refills | Status: DC

## 2016-07-30 NOTE — Telephone Encounter (Signed)
These notes in epic can be confusing, because they are not separating the note from just lab.   -04/2016 was a labs only visit for TSH, after she started back on her synthroid given in may (03/03/2016).  She then needed to return for follow up to see if this med dose was fine.   -I am refilling, but she needs to return for a visit and get her TSH rechecked to make sure this is at a therapeutic dose--make sure that you have taken the medication for a full 6-8 weeks

## 2016-08-06 ENCOUNTER — Ambulatory Visit (AMBULATORY_SURGERY_CENTER): Payer: Self-pay | Admitting: *Deleted

## 2016-08-06 ENCOUNTER — Telehealth: Payer: Self-pay | Admitting: Emergency Medicine

## 2016-08-06 VITALS — Ht 65.0 in | Wt 196.8 lb

## 2016-08-06 DIAGNOSIS — Z1211 Encounter for screening for malignant neoplasm of colon: Secondary | ICD-10-CM

## 2016-08-06 MED ORDER — NA SULFATE-K SULFATE-MG SULF 17.5-3.13-1.6 GM/177ML PO SOLN: 1.0000 | Freq: Once | ORAL | 0 refills | Status: AC

## 2016-08-06 NOTE — Telephone Encounter (Signed)
Pt aware History of hysterectomy deleted from chart.

## 2016-08-06 NOTE — Progress Notes (Signed)
No allergies to eggs or soy. No problems with anesthesia.  Pt given Emmi instructions for colonoscopy  No oxygen use  No diet drug use  

## 2016-08-07 ENCOUNTER — Encounter: Payer: Self-pay | Admitting: Gastroenterology

## 2016-08-17 ENCOUNTER — Encounter: Payer: Self-pay | Admitting: Gastroenterology

## 2016-08-17 ENCOUNTER — Ambulatory Visit (AMBULATORY_SURGERY_CENTER): Payer: BC Managed Care – PPO | Admitting: Gastroenterology

## 2016-08-17 VITALS — BP 122/64 | HR 64 | Temp 98.9°F | Resp 11 | Ht 65.0 in | Wt 196.0 lb

## 2016-08-17 DIAGNOSIS — K635 Polyp of colon: Secondary | ICD-10-CM | POA: Diagnosis not present

## 2016-08-17 DIAGNOSIS — K633 Ulcer of intestine: Secondary | ICD-10-CM

## 2016-08-17 DIAGNOSIS — Z1212 Encounter for screening for malignant neoplasm of rectum: Secondary | ICD-10-CM

## 2016-08-17 DIAGNOSIS — Z1211 Encounter for screening for malignant neoplasm of colon: Secondary | ICD-10-CM

## 2016-08-17 DIAGNOSIS — D128 Benign neoplasm of rectum: Secondary | ICD-10-CM

## 2016-08-17 DIAGNOSIS — D129 Benign neoplasm of anus and anal canal: Secondary | ICD-10-CM

## 2016-08-17 MED ORDER — SODIUM CHLORIDE 0.9 % IV SOLN: 500.0000 mL | INTRAVENOUS | Status: DC

## 2016-08-17 NOTE — Progress Notes (Signed)
Called to room to assist during endoscopic procedure.  Patient ID and intended procedure confirmed with present staff. Received instructions for my participation in the procedure from the performing physician.  

## 2016-08-17 NOTE — Progress Notes (Signed)
Report to PACU, RN, vss, BBS= Clear.  

## 2016-08-17 NOTE — Op Note (Addendum)
[facility] Patient Name: Alisha Welch Procedure Date: 08/17/2016 2:07 PM MRN: [id number] Endoscopist: Mauri Pole , MD Age: 60 Referring MD:  Date of Birth: 1956/03/19 Gender: Female Account #: [id number] Procedure:                Colonoscopy Indications:              Screening for colorectal malignant neoplasm, This                            is the patient's first colonoscopy Medicines:                Monitored Anesthesia Care Procedure:                Pre-Anesthesia Assessment:                           - Prior to the procedure, a History and Physical                            was performed, and patient medications and                            allergies were reviewed. The patient's tolerance of                            previous anesthesia was also reviewed. The risks                            and benefits of the procedure and the sedation                            options and risks were discussed with the patient.                            All questions were answered, and informed consent                            was obtained. Prior Anticoagulants: The patient has                            taken no previous anticoagulant or antiplatelet                            agents. ASA Grade Assessment: II - A patient with                            mild systemic disease. After reviewing the risks                            and benefits, the patient was deemed in                            satisfactory condition to undergo the procedure.  After obtaining informed consent, the colonoscope                            was passed under direct vision. Throughout the                            procedure, the patient's blood pressure, pulse, and                            oxygen saturations were monitored continuously. The                            Model CF-HQ190L [id number]) scope was introduced                            through the anus and  advanced to the the terminal                            ileum, with identification of the appendiceal                            orifice and IC valve. The colonoscopy was performed                            without difficulty. The patient tolerated the                            procedure well. The quality of the bowel                            preparation was excellent. The terminal ileum,                            ileocecal valve, appendiceal orifice, and rectum                            were photographed. Scope In: 2:18:23 PM Scope Out: 2:30:46 PM Scope Withdrawal Time: 0 hours 10 minutes 16 seconds  Total Procedure Duration: 0 hours 12 minutes 23 seconds  Findings:                 The perianal and digital rectal examinations were                            normal.                           Three sessile polyps were found in the                            recto-sigmoid colon. The polyps were 1 to 3 mm in                            size. These polyps were removed with a cold biopsy  forceps. Resection and retrieval were complete.                           Nonbleeding superficial ulcerated mucosa ~3-62mm                            with no stigmata of recent bleeding were present in                            the transverse colon. Biopsies were taken with a                            cold forceps for histology.                           Multiple small and large-mouthed diverticula were                            found in the sigmoid colon.                           Non-bleeding internal hemorrhoids were found during                            retroflexion. The hemorrhoids were medium-sized. Complications:            No immediate complications. Estimated Blood Loss:     Estimated blood loss was minimal. Impression:               - Three 1 to 3 mm polyps at the recto-sigmoid                            colon, removed with a cold biopsy forceps. Resected                             and retrieved.                           - Mucosal ulceration. Biopsied.                           - Diverticulosis in the sigmoid colon.                           - Non-bleeding internal hemorrhoids. Recommendation:           - Patient has a contact number available for                            emergencies. The signs and symptoms of potential                            delayed complications were discussed with the                            patient. Return to normal activities tomorrow.  Written discharge instructions were provided to the                            patient.                           - Resume previous diet.                           - Continue present medications.                           - Await pathology results.                           -Avoid NSAID's for 2 weeks and thereafter only as                            needed sparingly                           - Repeat colonoscopy in 5-10 years for surveillance                            based on pathology results.                           - Return to GI clinic at the next available                            appointment for hemorrhoidal band ligation. Mauri Pole, MD 08/17/2016 2:38:20 PM This report has been signed electronically.

## 2016-08-17 NOTE — Patient Instructions (Signed)
Discharge instructions given. Handouts on polyps and hemorrhoids. Hemorrhoidal banning brochure given. Resume previous medications. YOU HAD AN ENDOSCOPIC PROCEDURE TODAY AT [facility]:   Refer to the procedure report that was given to you for any specific questions about what was found during the examination.  If the procedure report does not answer your questions, please call your gastroenterologist to clarify.  If you requested that your care partner not be given the details of your procedure findings, then the procedure report has been included in a sealed envelope for you to review at your convenience later.  YOU SHOULD EXPECT: Some feelings of bloating in the abdomen. Passage of more gas than usual.  Walking can help get rid of the air that was put into your GI tract during the procedure and reduce the bloating. If you had a lower endoscopy (such as a colonoscopy or flexible sigmoidoscopy) you may notice spotting of blood in your stool or on the toilet paper. If you underwent a bowel prep for your procedure, you may not have a normal bowel movement for a few days.  Please Note:  You might notice some irritation and congestion in your nose or some drainage.  This is from the oxygen used during your procedure.  There is no need for concern and it should clear up in a day or so.  SYMPTOMS TO REPORT IMMEDIATELY:   Following lower endoscopy (colonoscopy or flexible sigmoidoscopy):  Excessive amounts of blood in the stool  Significant tenderness or worsening of abdominal pains  Swelling of the abdomen that is new, acute  Fever of 100F or higher   For urgent or emergent issues, a gastroenterologist can be reached at any hour by calling [phone].   DIET:  We do recommend a small meal at first, but then you may proceed to your regular diet.  Drink plenty of fluids but you should avoid alcoholic beverages for 24 hours.  ACTIVITY:  You should plan to take it easy for the  rest of today and you should NOT DRIVE or use heavy machinery until tomorrow (because of the sedation medicines used during the test).    FOLLOW UP: Our staff will call the number listed on your records the next business day following your procedure to check on you and address any questions or concerns that you may have regarding the information given to you following your procedure. If we do not reach you, we will leave a message.  However, if you are feeling well and you are not experiencing any problems, there is no need to return our call.  We will assume that you have returned to your regular daily activities without incident.  If any biopsies were taken you will be contacted by phone or by letter within the next 1-3 weeks.  Please call us at [phone] if you have not heard about the biopsies in 3 weeks.    SIGNATURES/CONFIDENTIALITY: You and/or your care partner have signed paperwork which will be entered into your electronic medical record.  These signatures attest to the fact that that the information above on your After Visit Summary has been reviewed and is understood.  Full responsibility of the confidentiality of this discharge information lies with you and/or your care-partner.

## 2016-08-20 ENCOUNTER — Telehealth: Payer: Self-pay | Admitting: *Deleted

## 2016-08-20 ENCOUNTER — Telehealth: Payer: Self-pay

## 2016-08-20 NOTE — Telephone Encounter (Signed)
  Follow up Call-  Call back number 08/17/2016  Post procedure Call Back phone  # [phone]  Permission to leave phone message Yes  Some recent data might be hidden     Patient questions:  Do you have a fever, pain , or abdominal swelling? No. Pain Score  0 *  Have you tolerated food without any problems? Yes.    Have you been able to return to your normal activities? Yes.    Do you have any questions about your discharge instructions: Diet   No. Medications  No. Follow up visit  No.  Do you have questions or concerns about your Care? No.  Actions: * If pain score is 4 or above: No action needed, pain <4.

## 2016-08-20 NOTE — Telephone Encounter (Signed)
  Follow up Call-  Call back number 08/17/2016  Post procedure Call Back phone  # [phone]  Permission to leave phone message Yes  Some recent data might be hidden    Patient was called for follow up after her procedure on 08/17/2016. No answer at the number given for follow up phone call. I was unable to leave a message.

## 2016-08-27 ENCOUNTER — Other Ambulatory Visit: Payer: Self-pay

## 2016-08-27 MED ORDER — MESALAMINE ER 0.375 G PO CP24: 1.5000 g | ORAL_CAPSULE | Freq: Every day | ORAL | 11 refills | Status: DC

## 2016-08-28 ENCOUNTER — Telehealth: Payer: Self-pay | Admitting: Gastroenterology

## 2016-08-28 NOTE — Telephone Encounter (Signed)
Mesalamine will be $1 co-pay per [facility] pharmacist. Patient notified. Appointment for follow up scheduled.

## 2016-09-29 ENCOUNTER — Other Ambulatory Visit: Payer: Self-pay | Admitting: Physician Assistant

## 2016-10-17 ENCOUNTER — Ambulatory Visit (INDEPENDENT_AMBULATORY_CARE_PROVIDER_SITE_OTHER): Payer: BC Managed Care – PPO | Admitting: Gastroenterology

## 2016-10-17 ENCOUNTER — Other Ambulatory Visit (INDEPENDENT_AMBULATORY_CARE_PROVIDER_SITE_OTHER): Payer: BC Managed Care – PPO

## 2016-10-17 ENCOUNTER — Encounter: Payer: Self-pay | Admitting: Gastroenterology

## 2016-10-17 VITALS — BP 118/80 | HR 76 | Ht 64.5 in | Wt 196.0 lb

## 2016-10-17 DIAGNOSIS — K5180 Other ulcerative colitis without complications: Secondary | ICD-10-CM

## 2016-10-17 LAB — HEPATIC FUNCTION PANEL
ALBUMIN: 4.2 g/dL (ref 3.5–5.2)
ALT: 14 U/L (ref 0–35)
AST: 17 U/L (ref 0–37)
Alkaline Phosphatase: 112 U/L (ref 39–117)
Bilirubin, Direct: 0.1 mg/dL (ref 0.0–0.3)
TOTAL PROTEIN: 7.3 g/dL (ref 6.0–8.3)
Total Bilirubin: 0.4 mg/dL (ref 0.2–1.2)

## 2016-10-17 LAB — CBC WITH DIFFERENTIAL/PLATELET
BASOS ABS: 0 10*3/uL (ref 0.0–0.1)
Basophils Relative: 0.5 % (ref 0.0–3.0)
Eosinophils Absolute: 0.2 10*3/uL (ref 0.0–0.7)
Eosinophils Relative: 2.5 % (ref 0.0–5.0)
HEMATOCRIT: 38 % (ref 36.0–46.0)
HEMOGLOBIN: 13 g/dL (ref 12.0–15.0)
LYMPHS ABS: 2.5 10*3/uL (ref 0.7–4.0)
LYMPHS PCT: 37.3 % (ref 12.0–46.0)
MCHC: 34.3 g/dL (ref 30.0–36.0)
MCV: 94.3 fl (ref 78.0–100.0)
MONOS PCT: 9.1 % (ref 3.0–12.0)
Monocytes Absolute: 0.6 10*3/uL (ref 0.1–1.0)
NEUTROS PCT: 50.6 % (ref 43.0–77.0)
Neutro Abs: 3.3 10*3/uL (ref 1.4–7.7)
Platelets: 354 10*3/uL (ref 150.0–400.0)
RBC: 4.03 Mil/uL (ref 3.87–5.11)
RDW: 12.6 % (ref 11.5–15.5)
WBC: 6.6 10*3/uL (ref 4.0–10.5)

## 2016-10-17 LAB — BASIC METABOLIC PANEL
BUN: 17 mg/dL (ref 6–23)
CHLORIDE: 105 meq/L (ref 96–112)
CO2: 25 meq/L (ref 19–32)
CREATININE: 0.75 mg/dL (ref 0.40–1.20)
Calcium: 10 mg/dL (ref 8.4–10.5)
GFR: 83.59 mL/min (ref >60.00)
GLUCOSE: 98 mg/dL (ref 70–99)
Potassium: 4.3 mEq/L (ref 3.5–5.1)
Sodium: 140 mEq/L (ref 135–145)

## 2016-10-17 LAB — HIGH SENSITIVITY CRP: CRP, High Sensitivity: 2.01 mg/L (ref 0.000–5.000)

## 2016-10-17 LAB — FOLATE: Folate: 12.1 ng/mL (ref >5.9)

## 2016-10-17 LAB — IBC PANEL
IRON: 97 ug/dL (ref 42–145)
Saturation Ratios: 31.2 % (ref 20.0–50.0)
TRANSFERRIN: 222 mg/dL (ref 212.0–360.0)

## 2016-10-17 LAB — SEDIMENTATION RATE: SED RATE: 12 mm/h (ref 0–30)

## 2016-10-17 LAB — FERRITIN: Ferritin: 117.3 ng/mL (ref 10.0–291.0)

## 2016-10-17 LAB — VITAMIN B12: Vitamin B-12: 290 pg/mL (ref 211–911)

## 2016-10-17 NOTE — Patient Instructions (Addendum)
Follow up in 7-8 months  Go to the basement for labs today

## 2016-10-17 NOTE — Progress Notes (Signed)
Alisha Welch    WT:[id number]    01-18-1956  Primary Care Physician:Stephanie English, [place]  Referring Physician: Joretta Bachelor, PA [place], [place] [address]  Chief complaint:  Ulcerative colitis  HPI: 61 year old female here for follow-up visit after recent colonoscopy in November 2017 with findings of ulcerated mucosa in transverse colon biopsies suggestive of chronic moderately active colitis, also had 3 sessile polyps removed from the rectum that were hyperplastic .  She was started on Apriso (mesalamine) subsequently . Patient reports that she started having normal formed 1-2 bowel movements after she started taking mesalamine , prior to that she was having semi-formed to liquid stool with fecal urgency and never thought there was something wrong.  She is currently asymptomatic and denies any blood pe rectum.  Weight is stable.  no NSAID's   Outpatient Encounter Prescriptions as of 10/17/2016  Medication Sig  . levothyroxine (SYNTHROID, LEVOTHROID) 100 MCG tablet Take 1 tablet (100 mcg total) by mouth daily.  . mesalamine (APRISO) 0.375 g 24 hr capsule Take 4 capsules (1.5 g total) by mouth daily.  . cetirizine (ZYRTEC) 10 MG tablet Take 1 tablet (10 mg total) by mouth daily. (Patient not taking: Reported on 10/17/2016)  . fluticasone (FLONASE) 50 MCG/ACT nasal spray Place 2 sprays into both nostrils daily. (Patient not taking: Reported on 10/17/2016)  . Guaifenesin (MUCINEX MAXIMUM STRENGTH) 1200 MG TB12 Take 1 tablet (1,200 mg total) by mouth every 12 (twelve) hours as needed. (Patient not taking: Reported on 10/17/2016)   Facility-Administered Encounter Medications as of 10/17/2016  Medication  . 0.9 %  sodium chloride infusion    Allergies as of 10/17/2016  . (No Known Allergies)    Past Medical History:  Diagnosis Date  . Allergy   . Chronic colitis   . Clotting disorder ([place])    blood clot from skiing accident in l arm  . Hyperlipidemia   .  Hyperplastic colon polyp   . Hypothyroidism     Past Surgical History:  Procedure Laterality Date  . BREAST BIOPSY Left 1989  . TRIGGER FINGER RELEASE  06/13/2012   Procedure: RELEASE TRIGGER FINGER/A-1 PULLEY;  Surgeon: Wynonia Sours, MD;  Location: [facility];  Service: Orthopedics;  Laterality: Right;  trigger release right thumb     Family History  Problem Relation Age of Onset  . Stroke Mother   . Heart disease Mother   . Lung cancer Father   . Heart disease Brother   . Colon cancer Maternal Grandmother 22  . Breast cancer Paternal Aunt   . Esophageal cancer Neg Hx   . Rectal cancer Neg Hx   . Stomach cancer Neg Hx     Social History   Social History  . Marital status: Single    Spouse name: N/A  . Number of children: 3  . Years of education: N/A   Occupational History  . [employer] facilitator    Social History Main Topics  . Smoking status: Never Smoker  . Smokeless tobacco: Never Used  . Alcohol use Yes     Comment: rare  . Drug use: No  . Sexual activity: Yes   Other Topics Concern  . Not on file   Social History Narrative  . No narrative on file      Review of systems: Review of Systems  Constitutional: Negative for fever and chills.  HENT: Negative.   Eyes: Negative for blurred vision.  Respiratory: Negative for cough, shortness of breath and wheezing.   Cardiovascular: Negative for chest pain and palpitations.  Gastrointestinal: as per HPI Genitourinary: Negative for dysuria, urgency, frequency and hematuria.  Musculoskeletal: Negative for myalgias, back pain and joint pain.  Skin: Negative for itching and rash.  Neurological: Negative for dizziness, tremors, focal weakness, seizures and loss of consciousness.  Endo/Heme/Allergies: Positive for seasonal allergies.  Psychiatric/Behavioral: Negative for depression, suicidal ideas and hallucinations.  All other systems reviewed and are negative.   Physical Exam: Vitals:   10/17/16  1424  BP: 118/80  Pulse: 76   Body mass index is 33.12 kg/m. Gen:      No acute distress HEENT:  EOMI, sclera anicteric Neck:     No masses; no thyromegaly Lungs:    Clear to auscultation bilaterally; normal respiratory effort CV:         Regular rate and rhythm; no murmurs Abd:      + bowel sounds; soft, non-tender; no palpable masses, no distension Ext:    No edema; adequate peripheral perfusion Skin:      Warm and dry; no rash Neuro: alert and oriented x 3 Psych: normal mood and affect  Data Reviewed:  Reviewed labs, radiology imaging, old records and pertinent past GI work up   Assessment and Plan/Recommendations: 61 year old female with recent diagnosis of ulcerative colitis with findings of superficial Ulcerations in the transverse colon during routine screening colonoscopy in November 2017   significant improvement of symptoms with mesalamine Continue current dose to maintain remission We will plan for colonoscopy in 1 year to evaluate response to therapy or sooner if needed Follow-up CBC, BMP, LFT, B12 folate, ferritin Return in 6 months 25 minutes was spent face-to-face with the patient. Greater than 50% of the time used for counseling as well as treatment plan and follow-up. She had multiple questions which were answered to her satisfaction  K. Denzil Magnuson , MD [phone] Mon-Fri 8a-5p [phone] after 5p, weekends, holidays  CC: Joretta Bachelor, [place]

## 2016-10-21 ENCOUNTER — Telehealth: Payer: Self-pay | Admitting: Physician Assistant

## 2017-01-30 ENCOUNTER — Other Ambulatory Visit: Payer: Self-pay | Admitting: Physician Assistant

## 2017-02-24 NOTE — Telephone Encounter (Signed)
No note needed 

## 2017-04-11 ENCOUNTER — Other Ambulatory Visit: Payer: Self-pay | Admitting: Emergency Medicine

## 2017-04-11 ENCOUNTER — Telehealth: Payer: Self-pay | Admitting: Physician Assistant

## 2017-04-11 MED ORDER — LEVOTHYROXINE SODIUM 100 MCG PO TABS: 100.0000 ug | ORAL_TABLET | Freq: Every day | ORAL | 0 refills | Status: DC

## 2017-04-11 NOTE — Telephone Encounter (Signed)
Medication given for #15 tablets until appointment in 2 weeks

## 2017-04-11 NOTE — Telephone Encounter (Signed)
Advised patient I can't refill medication until confirmed scheduled appointment. Last visit was x 1 year ago and no recent bloodwork on file.  Transferred to schedulers

## 2017-04-11 NOTE — Telephone Encounter (Signed)
Pt is needing a refill on synthroid enough for a month to give meds in system and then come in for blood work   Best number [phone] pt states this was supposed to be handled two weeks ago but nothing was in system since April 2018

## 2017-04-29 ENCOUNTER — Ambulatory Visit: Payer: BC Managed Care – PPO | Admitting: Physician Assistant

## 2017-04-29 ENCOUNTER — Ambulatory Visit (INDEPENDENT_AMBULATORY_CARE_PROVIDER_SITE_OTHER): Payer: BC Managed Care – PPO | Admitting: Physician Assistant

## 2017-04-29 ENCOUNTER — Encounter: Payer: Self-pay | Admitting: Physician Assistant

## 2017-04-29 VITALS — BP 131/84 | HR 65 | Temp 98.5°F | Resp 18 | Ht 64.5 in | Wt 199.2 lb

## 2017-04-29 DIAGNOSIS — E038 Other specified hypothyroidism: Secondary | ICD-10-CM

## 2017-04-29 MED ORDER — LEVOTHYROXINE SODIUM 100 MCG PO TABS: 100.0000 ug | ORAL_TABLET | Freq: Every day | ORAL | 1 refills | Status: DC

## 2017-04-29 NOTE — Patient Instructions (Addendum)
Please take medication as prescribed.  I would like you to return in 4-6 weeks for labs only.   I would also like you to schedule a physical exam.      IF you received an x-ray today, you will receive an invoice from [facility]. Please contact [facility] at [phone] with questions or concerns regarding your invoice.   IF you received labwork today, you will receive an invoice from [facility]. Please contact [facility] at [phone] with questions or concerns regarding your invoice.   Our billing staff will not be able to assist you with questions regarding bills from these companies.  You will be contacted with the lab results as soon as they are available. The fastest way to get your results is to activate your My Chart account. Instructions are located on the last page of this paperwork. If you have not heard from [place] regarding the results in 2 weeks, please contact this office.

## 2017-04-29 NOTE — Progress Notes (Signed)
PRIMARY CARE AT [facility] [address] [place] [address] [phone]  Date:  04/29/2017   Name:  Alisha Welch   DOB:  Dec 06, 1955   MRN:  [id number]  PCP:  Joretta Bachelor, PA    History of Present Illness:  Alisha Welch is a 61 y.o. female patient who presents to PCP with  Chief Complaint  Patient presents with  . Medication Refill    levothyroxine sodium      Patient has been taking levothyroixine but has not had it for more than  Month.  She has not noticed any changes chest pain, palpitations, sob, dyspnea, diarrhea, or constipation.  She had been taking an old prescription of the levothyroxine then took the newly prescribed 100mg  afterwards.  She had not followed up in the 6 weeks to recheck her tsh like she was supposed to do.  She has been a little more fatigued, and has also noticed that it is incredibly hard for her to lose weight.  She is eating carbs, and sweet teas.  Patient Active Problem List   Diagnosis Date Noted  . Tenosynovitis of thumb 05/27/2012    Past Medical History:  Diagnosis Date  . Allergy   . Chronic colitis   . Clotting disorder ([place])    blood clot from skiing accident in l arm  . Hyperlipidemia   . Hyperplastic colon polyp   . Hypothyroidism     Past Surgical History:  Procedure Laterality Date  . BREAST BIOPSY Left 1989  . TRIGGER FINGER RELEASE  06/13/2012   Procedure: RELEASE TRIGGER FINGER/A-1 PULLEY;  Surgeon: Wynonia Sours, MD;  Location: [facility];  Service: Orthopedics;  Laterality: Right;  trigger release right thumb     Social History  Substance Use Topics  . Smoking status: Never Smoker  . Smokeless tobacco: Never Used  . Alcohol use Yes     Comment: rare    Family History  Problem Relation Age of Onset  . Stroke Mother   . Heart disease Mother   . Lung cancer Father   . Heart disease Brother   . Colon cancer Maternal Grandmother 63  . Breast cancer Paternal Aunt   . Esophageal cancer Neg Hx    . Rectal cancer Neg Hx   . Stomach cancer Neg Hx     No Known Allergies  Medication list has been reviewed and updated.  Current Outpatient Prescriptions on File Prior to Visit  Medication Sig Dispense Refill  . cetirizine (ZYRTEC) 10 MG tablet Take 1 tablet (10 mg total) by mouth daily. 90 tablet 3  . fluticasone (FLONASE) 50 MCG/ACT nasal spray Place 2 sprays into both nostrils daily. 16 g 0  . levothyroxine (SYNTHROID, LEVOTHROID) 100 MCG tablet Take 1 tablet (100 mcg total) by mouth daily. 15 day supply given until seen by provider in two weeks 15 tablet 0  . Guaifenesin (MUCINEX MAXIMUM STRENGTH) 1200 MG TB12 Take 1 tablet (1,200 mg total) by mouth every 12 (twelve) hours as needed. (Patient not taking: Reported on 10/17/2016) 14 tablet 1  . mesalamine (APRISO) 0.375 g 24 hr capsule Take 4 capsules (1.5 g total) by mouth daily. 120 capsule 11   Current Facility-Administered Medications on File Prior to Visit  Medication Dose Route Frequency Provider Last Rate Last Dose  . 0.9 %  sodium chloride infusion  500 mL Intravenous Continuous Nandigam, Kavitha V, MD        ROS ROS otherwise unremarkable unless listed  above.  Physical Examination: BP 131/84   Pulse 65   Temp 98.5 F (36.9 C) (Oral)   Resp 18   Ht 5' 4.5" (1.638 m)   Wt 199 lb 3.2 oz (90.4 kg)   SpO2 98%   BMI 33.66 kg/m  Ideal Body Weight: Weight in (lb) to have BMI = 25: 147.6  Physical Exam  Constitutional: She is oriented to person, place, and time. She appears well-developed and well-nourished. No distress.  HENT:  Head: Normocephalic and atraumatic.  Right Ear: External ear normal.  Left Ear: External ear normal.  Eyes: Pupils are equal, round, and reactive to light. Conjunctivae and EOM are normal.  Cardiovascular: Normal rate and regular rhythm.  Exam reveals no friction rub.   No murmur heard. Pulmonary/Chest: Effort normal. No respiratory distress. She has no wheezes.  Neurological: She is alert  and oriented to person, place, and time.  Skin: She is not diaphoretic.  Psychiatric: She has a normal mood and affect. Her behavior is normal.     Assessment and Plan: Alisha Welch is a 61 y.o. female who is here today  She will decrease her teas She will return in 6 weeks for recheck of her thyroid. Advised return within 4 months for physical exam.  Other specified hypothyroidism - Plan: Thyroid Panel With TSH, levothyroxine (SYNTHROID, LEVOTHROID) 100 MCG tablet, CANCELED: Thyroid Panel With TSH, CANCELED: TSH  Ivar Drape, PA-C [facility] 7/24/20188:38 AM

## 2017-04-30 ENCOUNTER — Telehealth: Payer: Self-pay | Admitting: Physician Assistant

## 2017-04-30 ENCOUNTER — Other Ambulatory Visit: Payer: Self-pay | Admitting: Emergency Medicine

## 2017-04-30 MED ORDER — CYCLOBENZAPRINE HCL 10 MG PO TABS: 10.0000 mg | ORAL_TABLET | Freq: Three times a day (TID) | ORAL | 0 refills | Status: DC | PRN

## 2017-04-30 NOTE — Progress Notes (Unsigned)
.  fle

## 2017-04-30 NOTE — Telephone Encounter (Signed)
PATIENT SAW STEPHANIE ENGLISH Monday (04/29/17) AND SHE MENTIONED TO HER THAT SHE WAS HAVING PAIN IN HER SHOULDER, NECK AND ARM. STEPHANIE TOLD HER SHE WOULD SEND IN A MUSCLE RELAXER BUT WHEN SHE WENT TO THE PHARMACY THEY TOLD HER SHE DIDN'T SEND IN ANYTHING BUT HER THYROID MEDICINE. BEST PHONE [phone] (CELL) PHARMACY CHOICE IS [facility] ON [address]. [place]

## 2017-04-30 NOTE — Telephone Encounter (Signed)
Verbal order given to order Flexeril 10 mg tab TID as needed, #30, no refills per [place]. Pt aware [facility]

## 2017-06-18 ENCOUNTER — Ambulatory Visit: Payer: BC Managed Care – PPO | Admitting: Emergency Medicine

## 2017-06-18 DIAGNOSIS — E038 Other specified hypothyroidism: Secondary | ICD-10-CM

## 2017-06-19 ENCOUNTER — Telehealth: Payer: Self-pay | Admitting: Physician Assistant

## 2017-06-19 ENCOUNTER — Other Ambulatory Visit: Payer: Self-pay | Admitting: Physician Assistant

## 2017-06-19 LAB — THYROID PANEL WITH TSH
Free Thyroxine Index: 2.4 (ref 1.2–4.9)
T3 UPTAKE RATIO: 27 % (ref 24–39)
T4 TOTAL: 9 ug/dL (ref 4.5–12.0)
TSH: 0.166 u[IU]/mL — AB (ref 0.450–4.500)

## 2017-06-19 MED ORDER — LEVOTHYROXINE SODIUM 75 MCG PO TABS: 75.0000 ug | ORAL_TABLET | Freq: Every day | ORAL | 1 refills | Status: DC

## 2017-06-19 NOTE — Telephone Encounter (Signed)
-----   Message from Ebony Hail sent at 06/19/2017 12:36 PM EDT ----- Called and spoke with patient, informed of results and the new Rx.  She stated that she still has thyroid meds. (the old one) for another week, and she is wondering if she should start 75 mcg immediately, or she can finish the meds she has left over. Please advise. Thanks Otherwise, she said she feel tired with no energy.

## 2017-06-19 NOTE — Telephone Encounter (Signed)
No! she needs to start the lower dose.  She keeps taking left over and if it is showing hyperthyroid.  If you take doses that you don't need, you can harm yourself.  That is why I give her very little(30 days worth with one refill), in case we need to change the dose.  She should stop these immediately.  Throw away the old prescriptions, and start the 70mcg immediately.  If you are having no improvement of your symptoms in 1-2 weeks, come in as well as for what I previously stated.

## 2017-06-20 ENCOUNTER — Telehealth: Payer: Self-pay | Admitting: *Deleted

## 2017-06-20 NOTE — Telephone Encounter (Signed)
LMOM for pt to call the office °

## 2017-06-20 NOTE — Telephone Encounter (Signed)
Informed pt of providers advice about starting the new medication immediately. Also informed pt that if she starts feeling any worst to come back into the office for a visit to be further evaluated.

## 2017-08-13 ENCOUNTER — Other Ambulatory Visit: Payer: Self-pay | Admitting: Physician Assistant

## 2017-08-23 ENCOUNTER — Encounter: Payer: Self-pay | Admitting: Gastroenterology

## 2017-09-08 ENCOUNTER — Other Ambulatory Visit: Payer: Self-pay | Admitting: Emergency Medicine

## 2017-09-13 ENCOUNTER — Encounter: Payer: Self-pay | Admitting: Emergency Medicine

## 2017-09-13 ENCOUNTER — Other Ambulatory Visit: Payer: Self-pay

## 2017-09-13 ENCOUNTER — Ambulatory Visit: Payer: BC Managed Care – PPO | Admitting: Emergency Medicine

## 2017-09-13 VITALS — BP 132/84 | HR 71 | Temp 98.6°F | Resp 16 | Ht 64.5 in | Wt 188.8 lb

## 2017-09-13 DIAGNOSIS — R51 Headache: Secondary | ICD-10-CM

## 2017-09-13 DIAGNOSIS — G500 Trigeminal neuralgia: Secondary | ICD-10-CM | POA: Diagnosis not present

## 2017-09-13 DIAGNOSIS — R519 Headache, unspecified: Secondary | ICD-10-CM

## 2017-09-13 MED ORDER — HYDROCODONE-ACETAMINOPHEN 5-325 MG PO TABS: 1.0000 | ORAL_TABLET | Freq: Four times a day (QID) | ORAL | 0 refills | Status: DC | PRN

## 2017-09-13 MED ORDER — PREDNISONE 20 MG PO TABS: 40.0000 mg | ORAL_TABLET | Freq: Every day | ORAL | 0 refills | Status: AC

## 2017-09-13 NOTE — Patient Instructions (Addendum)
     IF you received an x-ray today, you will receive an invoice from [facility]. Please contact [facility] at [phone] with questions or concerns regarding your invoice.   IF you received labwork today, you will receive an invoice from [facility]. Please contact [facility] at [phone] with questions or concerns regarding your invoice.   Our billing staff will not be able to assist you with questions regarding bills from these companies.  You will be contacted with the lab results as soon as they are available. The fastest way to get your results is to activate your My Chart account. Instructions are located on the last page of this paperwork. If you have not heard from [place] regarding the results in 2 weeks, please contact this office.     Trigeminal Neuralgia Trigeminal neuralgia is a nerve disorder that causes attacks of severe facial pain. The attacks last from a few seconds to several minutes. They can happen for days, weeks, or months and then go away for months or years. Trigeminal neuralgia is also called tic douloureux. What are the causes? This condition is caused by damage to a nerve in the face that is called the trigeminal nerve. An attack can be triggered by:  Talking.  Chewing.  Putting on makeup.  Washing your face.  Shaving your face.  Brushing your teeth.  Touching your face.  What increases the risk? This condition is more likely to develop in:  Women.  People who are 57 years of age or older.  What are the signs or symptoms? The main symptom of this condition is pain in the jaw, lips, eyes, nose, scalp, forehead, and face. The pain may be intense, stabbing, electric, or shock-like. How is this diagnosed? This condition is diagnosed with a physical exam. A CT scan or MRI may be done to rule out other conditions that can cause facial pain. How is this treated? This condition may be treated with:  Avoiding the things that trigger  your attacks.  Pain medicine.  Surgery. This may be done in severe cases if other medical treatment does not provide relief.  Follow these instructions at home:  Take over-the-counter and prescription medicines only as told by your health care provider.  If you wish to get pregnant, talk with your health care provider before you start trying to get pregnant.  Avoid the things that trigger your attacks. It may help to: ? Chew on the unaffected side of your mouth. ? Avoid touching your face. ? Avoid blasts of hot or cold air. Contact a health care provider if:  Your pain medicine is not helping.  You develop new, unexplained symptoms, such as: ? Double vision. ? Facial weakness. ? Changes in hearing or balance.  You become pregnant. Get help right away if:  Your pain is unbearable, and your pain medicine does not help. This information is not intended to replace advice given to you by your health care provider. Make sure you discuss any questions you have with your health care provider. Document Released: 09/21/2000 Document Revised: 05/27/2016 Document Reviewed: 01/17/2015 Elsevier Interactive Patient Education  [employer].

## 2017-09-13 NOTE — Progress Notes (Signed)
Alisha Welch 61 y.o.   Chief Complaint  Patient presents with  . Dental Pain    patient presents with dental pain to R side of jaw x 10 days, states that she went to dentist yesterday, no cavities or infection    HISTORY OF PRESENT ILLNESS: This is a 61 y.o. female complaining of right sided facial pain that started 1 week ago; seen by dentist yesterday and work up was negative. No trauma, fever, skin rash, n/v, visual changes, scalp pain, sinus pain, nasal discharge, nosebleeds, or any other significant symptoms.  HPI   Prior to Admission medications   Medication Sig Start Date End Date Taking? Authorizing Provider  cetirizine (ZYRTEC) 10 MG tablet Take 1 tablet (10 mg total) by mouth daily. 05/16/16  Yes English, Colletta [place] D, PA  cyclobenzaprine (FLEXERIL) 10 MG tablet Take 1 tablet (10 mg total) by mouth 3 (three) times daily as needed for muscle spasms. 04/30/17  Yes English, Stephanie D, PA  fluticasone (FLONASE) 50 MCG/ACT nasal spray Place 2 sprays into both nostrils daily. 03/03/16  Yes English, Stephanie D, PA  SYNTHROID 75 MCG tablet TAKE 1 TABLET (75 MCG TOTAL) DAILY BY MOUTH. OFFICE VISIT NEEDED FOR REFILLS 09/09/17  Yes English, Stephanie D, PA  Guaifenesin (MUCINEX MAXIMUM STRENGTH) 1200 MG TB12 Take 1 tablet (1,200 mg total) by mouth every 12 (twelve) hours as needed. Patient not taking: Reported on 09/13/2017 03/03/16   Ivar Drape D, PA  mesalamine (APRISO) 0.375 g 24 hr capsule Take 4 capsules (1.5 g total) by mouth daily. 08/27/16 10/17/16  Mauri Pole, MD    No Known Allergies  Patient Active Problem List   Diagnosis Date Noted  . Tenosynovitis of thumb 05/27/2012    Past Medical History:  Diagnosis Date  . Allergy   . Chronic colitis   . Clotting disorder ([place])    blood clot from skiing accident in l arm  . Hyperlipidemia   . Hyperplastic Alisha polyp   . Hypothyroidism     Past Surgical History:  Procedure Laterality Date  . BREAST BIOPSY  Left 1989  . TRIGGER FINGER RELEASE  06/13/2012   Procedure: RELEASE TRIGGER FINGER/A-1 PULLEY;  Surgeon: Wynonia Sours, MD;  Location: [facility];  Service: Orthopedics;  Laterality: Right;  trigger release right thumb     Social History   Socioeconomic History  . Marital status: Single    Spouse name: Not on file  . Number of children: 3  . Years of education: Not on file  . Highest education level: Not on file  Social Needs  . Financial resource strain: Not on file  . Food insecurity - worry: Not on file  . Food insecurity - inability: Not on file  . Transportation needs - medical: Not on file  . Transportation needs - non-medical: Not on file  Occupational History  . 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 Smoking status: Never Smoker  . Smokeless tobacco: Never Used  Substance and Sexual Activity  . Alcohol use: Yes    Comment: rare  . Drug use: No  . Sexual activity: Yes  Other Topics Concern  . Not on file  Social History Narrative  . Not on file    Family History  Problem Relation Age of Onset  . Stroke Mother   . Heart disease Mother   . Lung cancer Father   . Heart disease Brother   . Alisha cancer Maternal Grandmother 52  . Breast  cancer Paternal Aunt   . Esophageal cancer Neg Hx   . Rectal cancer Neg Hx   . Stomach cancer Neg Hx      Review of Systems  Constitutional: Negative.  Negative for chills and fever.  HENT: Negative.  Negative for congestion, ear pain, hearing loss, nosebleeds, sinus pain and sore throat.   Eyes: Negative.  Negative for blurred vision, double vision, photophobia, pain, discharge and redness.  Respiratory: Negative.  Negative for cough and shortness of breath.   Cardiovascular: Negative.  Negative for chest pain and palpitations.  Gastrointestinal: Negative.  Negative for abdominal pain, diarrhea, nausea and vomiting.  Genitourinary: Negative.  Negative for dysuria and hematuria.  Musculoskeletal: Negative for  back pain, myalgias and neck pain.  Skin: Negative.  Negative for rash.  Neurological: Negative.  Negative for dizziness, tingling, sensory change, focal weakness and headaches.  Endo/Heme/Allergies: Negative.   All other systems reviewed and are negative.    Vitals:   09/13/17 1614  BP: 132/84  Pulse: 71  Resp: 16  Temp: 98.6 F (37 C)  SpO2: 100%     Physical Exam  Constitutional: She is oriented to person, place, and time. She appears well-developed and well-nourished.  HENT:  Head: Normocephalic and atraumatic.  Right Ear: Tympanic membrane, external ear and ear canal normal.  Left Ear: Tympanic membrane, external ear and ear canal normal.  Nose: Nose normal.  Mouth/Throat: Oropharynx is clear and moist.  Eyes: Conjunctivae and EOM are normal. Pupils are equal, round, and reactive to light.  Neck: Normal range of motion. Neck supple. No JVD present. No thyromegaly present.  Cardiovascular: Normal rate, regular rhythm and normal heart sounds.  Pulmonary/Chest: Effort normal and breath sounds normal.  Musculoskeletal: Normal range of motion.  Lymphadenopathy:    She has no cervical adenopathy.  Neurological: She is alert and oriented to person, place, and time. She displays normal reflexes. No cranial nerve deficit or sensory deficit. She exhibits normal muscle tone. Coordination normal.  Skin: Skin is warm and dry. Capillary refill takes less than 2 seconds. No rash noted.  Psychiatric: She has a normal mood and affect. Her behavior is normal.  Vitals reviewed.    ASSESSMENT & PLAN: Alisha Welch was seen today for dental pain.  Diagnoses and all orders for this visit:  Right sided facial pain -     predniSONE (DELTASONE) 20 MG tablet; Take 2 tablets (40 mg total) by mouth daily with breakfast for 5 days. -     HYDROcodone-acetaminophen (NORCO) 5-325 MG tablet; Take 1 tablet by mouth every 6 (six) hours as needed for moderate pain. -     CBC with Differential/Platelet -      Comprehensive metabolic panel -     Sedimentation Rate  Facial pain syndrome    Patient Instructions       IF you received an x-ray today, you will receive an invoice from [facility]. Please contact [facility] at [phone] with questions or concerns regarding your invoice.   IF you received labwork today, you will receive an invoice from [facility]. Please contact [facility] at [phone] with questions or concerns regarding your invoice.   Our billing staff will not be able to assist you with questions regarding bills from these companies.  You will be contacted with the lab results as soon as they are available. The fastest way to get your results is to activate your My Chart account. Instructions are located on the last page of this paperwork. If you  have not heard from [place] regarding the results in 2 weeks, please contact this office.     Trigeminal Neuralgia Trigeminal neuralgia is a nerve disorder that causes attacks of severe facial pain. The attacks last from a few seconds to several minutes. They can happen for days, weeks, or months and then go away for months or years. Trigeminal neuralgia is also called tic douloureux. What are the causes? This condition is caused by damage to a nerve in the face that is called the trigeminal nerve. An attack can be triggered by:  Talking.  Chewing.  Putting on makeup.  Washing your face.  Shaving your face.  Brushing your teeth.  Touching your face.  What increases the risk? This condition is more likely to develop in:  Women.  People who are 11 years of age or older.  What are the signs or symptoms? The main symptom of this condition is pain in the jaw, lips, eyes, nose, scalp, forehead, and face. The pain may be intense, stabbing, electric, or shock-like. How is this diagnosed? This condition is diagnosed with a physical exam. A CT scan or MRI may be done to rule out other conditions that can  cause facial pain. How is this treated? This condition may be treated with:  Avoiding the things that trigger your attacks.  Pain medicine.  Surgery. This may be done in severe cases if other medical treatment does not provide relief.  Follow these instructions at home:  Take over-the-counter and prescription medicines only as told by your health care provider.  If you wish to get pregnant, talk with your health care provider before you start trying to get pregnant.  Avoid the things that trigger your attacks. It may help to: ? Chew on the unaffected side of your mouth. ? Avoid touching your face. ? Avoid blasts of hot or cold air. Contact a health care provider if:  Your pain medicine is not helping.  You develop new, unexplained symptoms, such as: ? Double vision. ? Facial weakness. ? Changes in hearing or balance.  You become pregnant. Get help right away if:  Your pain is unbearable, and your pain medicine does not help. This information is not intended to replace advice given to you by your health care provider. Make sure you discuss any questions you have with your health care provider. Document Released: 09/21/2000 Document Revised: 05/27/2016 Document Reviewed: 01/17/2015 Elsevier Interactive Patient Education  2018 Elsevier Inc.      Agustina Caroli, MD [facility]

## 2017-09-14 LAB — CBC WITH DIFFERENTIAL/PLATELET
BASOS ABS: 0 10*3/uL (ref 0.0–0.2)
Basos: 0 %
EOS (ABSOLUTE): 0.1 10*3/uL (ref 0.0–0.4)
Eos: 1 %
Hematocrit: 40.6 % (ref 34.0–46.6)
Hemoglobin: 13.3 g/dL (ref 11.1–15.9)
Immature Grans (Abs): 0 10*3/uL (ref 0.0–0.1)
Immature Granulocytes: 0 %
LYMPHS ABS: 2.5 10*3/uL (ref 0.7–3.1)
Lymphs: 32 %
MCH: 31.4 pg (ref 26.6–33.0)
MCHC: 32.8 g/dL (ref 31.5–35.7)
MCV: 96 fL (ref 79–97)
MONOS ABS: 0.6 10*3/uL (ref 0.1–0.9)
Monocytes: 8 %
NEUTROS PCT: 59 %
Neutrophils Absolute: 4.4 10*3/uL (ref 1.4–7.0)
PLATELETS: 361 10*3/uL (ref 150–379)
RBC: 4.24 x10E6/uL (ref 3.77–5.28)
RDW: 13.1 % (ref 12.3–15.4)
WBC: 7.6 10*3/uL (ref 3.4–10.8)

## 2017-09-14 LAB — COMPREHENSIVE METABOLIC PANEL
ALK PHOS: 104 IU/L (ref 39–117)
ALT: 16 IU/L (ref 0–32)
AST: 19 IU/L (ref 0–40)
Albumin/Globulin Ratio: 2 (ref 1.2–2.2)
Albumin: 4.6 g/dL (ref 3.6–4.8)
BUN/Creatinine Ratio: 21 (ref 12–28)
BUN: 15 mg/dL (ref 8–27)
Bilirubin Total: 0.3 mg/dL (ref 0.0–1.2)
CO2: 23 mmol/L (ref 20–29)
CREATININE: 0.7 mg/dL (ref 0.57–1.00)
Calcium: 9.9 mg/dL (ref 8.7–10.3)
Chloride: 107 mmol/L — ABNORMAL HIGH (ref 96–106)
GFR calc Af Amer: 108 mL/min/{1.73_m2} (ref >59)
GFR calc non Af Amer: 94 mL/min/{1.73_m2} (ref >59)
GLOBULIN, TOTAL: 2.3 g/dL (ref 1.5–4.5)
GLUCOSE: 101 mg/dL — AB (ref 65–99)
Potassium: 4.3 mmol/L (ref 3.5–5.2)
SODIUM: 143 mmol/L (ref 134–144)
Total Protein: 6.9 g/dL (ref 6.0–8.5)

## 2017-09-14 LAB — SEDIMENTATION RATE: Sed Rate: 14 mm/hr (ref 0–40)

## 2017-09-18 ENCOUNTER — Encounter: Payer: Self-pay | Admitting: Radiology

## 2017-09-18 ENCOUNTER — Other Ambulatory Visit: Payer: Self-pay

## 2017-09-18 ENCOUNTER — Encounter: Payer: Self-pay | Admitting: Emergency Medicine

## 2017-09-18 ENCOUNTER — Ambulatory Visit: Payer: BC Managed Care – PPO | Admitting: Emergency Medicine

## 2017-09-18 VITALS — BP 110/72 | HR 70 | Temp 98.8°F | Resp 16 | Ht 65.5 in | Wt 190.0 lb

## 2017-09-18 DIAGNOSIS — J019 Acute sinusitis, unspecified: Secondary | ICD-10-CM

## 2017-09-18 DIAGNOSIS — R51 Headache: Secondary | ICD-10-CM

## 2017-09-18 DIAGNOSIS — R519 Headache, unspecified: Secondary | ICD-10-CM

## 2017-09-18 DIAGNOSIS — G500 Trigeminal neuralgia: Secondary | ICD-10-CM

## 2017-09-18 MED ORDER — FLUTICASONE PROPIONATE 50 MCG/ACT NA SUSP: 2.0000 | Freq: Every day | NASAL | 0 refills | Status: DC

## 2017-09-18 NOTE — Patient Instructions (Addendum)
   IF you received an x-ray today, you will receive an invoice from [facility]. Please contact [facility] at [phone] with questions or concerns regarding your invoice.   IF you received labwork today, you will receive an invoice from [facility]. Please contact [facility] at [phone] with questions or concerns regarding your invoice.   Our billing staff will not be able to assist you with questions regarding bills from these companies.  You will be contacted with the lab results as soon as they are available. The fastest way to get your results is to activate your My Chart account. Instructions are located on the last page of this paperwork. If you have not heard from us regarding the results in 2 weeks, please contact this office.    Health Maintenance, Female Adopting a healthy lifestyle and getting preventive care can go a long way to promote health and wellness. Talk with your health care provider about what schedule of regular examinations is right for you. This is a good chance for you to check in with your provider about disease prevention and staying healthy. In between checkups, there are plenty of things you can do on your own. Experts have done a lot of research about which lifestyle changes and preventive measures are most likely to keep you healthy. Ask your health care provider for more information. Weight and diet Eat a healthy diet  Be sure to include plenty of vegetables, fruits, low-fat dairy products, and lean protein.  Do not eat a lot of foods high in solid fats, added sugars, or salt.  Get regular exercise. This is one of the most important things you can do for your health. ? Most adults should exercise for at least 150 minutes each week. The exercise should increase your heart rate and make you sweat (moderate-intensity exercise). ? Most adults should also do strengthening exercises at least twice a week. This is in addition to the  moderate-intensity exercise.  Maintain a healthy weight  Body mass index (BMI) is a measurement that can be used to identify possible weight problems. It estimates body fat based on height and weight. Your health care provider can help determine your BMI and help you achieve or maintain a healthy weight.  For females 20 years of age and older: ? A BMI below 18.5 is considered underweight. ? A BMI of 18.5 to 24.9 is normal. ? A BMI of 25 to 29.9 is considered overweight. ? A BMI of 30 and above is considered obese.  Watch levels of cholesterol and blood lipids  You should start having your blood tested for lipids and cholesterol at 61 years of age, then have this test every 5 years.  You may need to have your cholesterol levels checked more often if: ? Your lipid or cholesterol levels are high. ? You are older than 61 years of age. ? You are at high risk for heart disease.  Cancer screening Lung Cancer  Lung cancer screening is recommended for adults 55-80 years old who are at high risk for lung cancer because of a history of smoking.  A yearly low-dose CT scan of the lungs is recommended for people who: ? Currently smoke. ? Have quit within the past 15 years. ? Have at least a 30-pack-year history of smoking. A pack year is smoking an average of one pack of cigarettes a day for 1 year.  Yearly screening should continue until it has been 15 years since you quit.  Yearly screening   should stop if you develop a health problem that would prevent you from having lung cancer treatment.  Breast Cancer  Practice breast self-awareness. This means understanding how your breasts normally appear and feel.  It also means doing regular breast self-exams. Let your health care provider know about any changes, no matter how small.  If you are in your 20s or 30s, you should have a clinical breast exam (CBE) by a health care provider every 1-3 years as part of a regular health exam.  If you  are 42 or older, have a CBE every year. Also consider having a breast X-ray (mammogram) every year.  If you have a family history of breast cancer, talk to your health care provider about genetic screening.  If you are at high risk for breast cancer, talk to your health care provider about having an MRI and a mammogram every year.  Breast cancer gene (BRCA) assessment is recommended for women who have family members with BRCA-related cancers. BRCA-related cancers include: ? Breast. ? Ovarian. ? Tubal. ? Peritoneal cancers.  Results of the assessment will determine the need for genetic counseling and BRCA1 and BRCA2 testing.  Cervical Cancer Your health care provider may recommend that you be screened regularly for cancer of the pelvic organs (ovaries, uterus, and vagina). This screening involves a pelvic examination, including checking for microscopic changes to the surface of your cervix (Pap test). You may be encouraged to have this screening done every 3 years, beginning at age 56.  For women ages 41-65, health care providers may recommend pelvic exams and Pap testing every 3 years, or they may recommend the Pap and pelvic exam, combined with testing for human papilloma virus (HPV), every 5 years. Some types of HPV increase your risk of cervical cancer. Testing for HPV may also be done on women of any age with unclear Pap test results.  Other health care providers may not recommend any screening for nonpregnant women who are considered low risk for pelvic cancer and who do not have symptoms. Ask your health care provider if a screening pelvic exam is right for you.  If you have had past treatment for cervical cancer or a condition that could lead to cancer, you need Pap tests and screening for cancer for at least 20 years after your treatment. If Pap tests have been discontinued, your risk factors (such as having a new sexual partner) need to be reassessed to determine if screening should  resume. Some women have medical problems that increase the chance of getting cervical cancer. In these cases, your health care provider may recommend more frequent screening and Pap tests.  Colorectal Cancer  This type of cancer can be detected and often prevented.  Routine colorectal cancer screening usually begins at 61 years of age and continues through 61 years of age.  Your health care provider may recommend screening at an earlier age if you have risk factors for colon cancer.  Your health care provider may also recommend using home test kits to check for hidden blood in the stool.  A small camera at the end of a tube can be used to examine your colon directly (sigmoidoscopy or colonoscopy). This is done to check for the earliest forms of colorectal cancer.  Routine screening usually begins at age 16.  Direct examination of the colon should be repeated every 5-10 years through 61 years of age. However, you may need to be screened more often if early forms of precancerous polyps or  small growths are found.  Skin Cancer  Check your skin from head to toe regularly.  Tell your health care provider about any new moles or changes in moles, especially if there is a change in a mole's shape or color.  Also tell your health care provider if you have a mole that is larger than the size of a pencil eraser.  Always use sunscreen. Apply sunscreen liberally and repeatedly throughout the day.  Protect yourself by wearing long sleeves, pants, a wide-brimmed hat, and sunglasses whenever you are outside.  Heart disease, diabetes, and high blood pressure  High blood pressure causes heart disease and increases the risk of stroke. High blood pressure is more likely to develop in: ? People who have blood pressure in the high end of the normal range (130-139/85-89 mm Hg). ? People who are overweight or obese. ? People who are African American.  If you are 61-36 years of age, have your blood  pressure checked every 3-5 years. If you are 6 years of age or older, have your blood pressure checked every year. You should have your blood pressure measured twice-once when you are at a hospital or clinic, and once when you are not at a hospital or clinic. Record the average of the two measurements. To check your blood pressure when you are not at a hospital or clinic, you can use: ? An automated blood pressure machine at a pharmacy. ? A home blood pressure monitor.  If you are between 7 years and 29 years old, ask your health care provider if you should take aspirin to prevent strokes.  Have regular diabetes screenings. This involves taking a blood sample to check your fasting blood sugar level. ? If you are at a normal weight and have a low risk for diabetes, have this test once every three years after 61 years of age. ? If you are overweight and have a high risk for diabetes, consider being tested at a younger age or more often. Preventing infection Hepatitis B  If you have a higher risk for hepatitis B, you should be screened for this virus. You are considered at high risk for hepatitis B if: ? You were born in a country where hepatitis B is common. Ask your health care provider which countries are considered high risk. ? Your parents were born in a high-risk country, and you have not been immunized against hepatitis B (hepatitis B vaccine). ? You have HIV or AIDS. ? You use needles to inject street drugs. ? You live with someone who has hepatitis B. ? You have had sex with someone who has hepatitis B. ? You get hemodialysis treatment. ? You take certain medicines for conditions, including cancer, organ transplantation, and autoimmune conditions.  Hepatitis C  Blood testing is recommended for: ? Everyone born from [birth year] through 1965. ? Anyone with known risk factors for hepatitis C.  Sexually transmitted infections (STIs)  You should be screened for sexually transmitted  infections (STIs) including gonorrhea and chlamydia if: ? You are sexually active and are younger than 61 years of age. ? You are older than 61 years of age and your health care provider tells you that you are at risk for this type of infection. ? Your sexual activity has changed since you were last screened and you are at an increased risk for chlamydia or gonorrhea. Ask your health care provider if you are at risk.  If you do not have HIV, but are at risk, it  may be recommended that you take a prescription medicine daily to prevent HIV infection. This is called pre-exposure prophylaxis (PrEP). You are considered at risk if: ? You are sexually active and do not regularly use condoms or know the HIV status of your partner(s). ? You take drugs by injection. ? You are sexually active with a partner who has HIV.  Talk with your health care provider about whether you are at high risk of being infected with HIV. If you choose to begin PrEP, you should first be tested for HIV. You should then be tested every 3 months for as long as you are taking PrEP. Pregnancy  If you are premenopausal and you may become pregnant, ask your health care provider about preconception counseling.  If you may become pregnant, take 400 to 800 micrograms (mcg) of folic acid every day.  If you want to prevent pregnancy, talk to your health care provider about birth control (contraception). Osteoporosis and menopause  Osteoporosis is a disease in which the bones lose minerals and strength with aging. This can result in serious bone fractures. Your risk for osteoporosis can be identified using a bone density scan.  If you are 65 years of age or older, or if you are at risk for osteoporosis and fractures, ask your health care provider if you should be screened.  Ask your health care provider whether you should take a calcium or vitamin D supplement to lower your risk for osteoporosis.  Menopause may have certain physical  symptoms and risks.  Hormone replacement therapy may reduce some of these symptoms and risks. Talk to your health care provider about whether hormone replacement therapy is right for you. Follow these instructions at home:  Schedule regular health, dental, and eye exams.  Stay current with your immunizations.  Do not use any tobacco products including cigarettes, chewing tobacco, or electronic cigarettes.  If you are pregnant, do not drink alcohol.  If you are breastfeeding, limit how much and how often you drink alcohol.  Limit alcohol intake to no more than 1 drink per day for nonpregnant women. One drink equals 12 ounces of beer, 5 ounces of wine, or 1 ounces of hard liquor.  Do not use street drugs.  Do not share needles.  Ask your health care provider for help if you need support or information about quitting drugs.  Tell your health care provider if you often feel depressed.  Tell your health care provider if you have ever been abused or do not feel safe at home. This information is not intended to replace advice given to you by your health care provider. Make sure you discuss any questions you have with your health care provider. Document Released: 04/09/2011 Document Revised: 03/01/2016 Document Reviewed: 06/28/2015 Elsevier Interactive Patient Education  2018 Elsevier Inc.  

## 2017-09-18 NOTE — Addendum Note (Signed)
Addended by: Davina Poke on: 09/18/2017 04:11 PM   Modules accepted: Orders

## 2017-09-18 NOTE — Progress Notes (Signed)
Colon Flattery 61 y.o.   Chief Complaint  Patient presents with  . Follow-up    RIGHT SIDED facial pain    HISTORY OF PRESENT ILLNESS: This is a 61 y.o. female here for follow up of right sided facial pain; seen by me 09/13/2017. Much better; pain-free.  HPI   Prior to Admission medications   Medication Sig Start Date End Date Taking? Authorizing Provider  cetirizine (ZYRTEC) 10 MG tablet Take 1 tablet (10 mg total) by mouth daily. 05/16/16  Yes English, Colletta [place] D, PA  cyclobenzaprine (FLEXERIL) 10 MG tablet Take 1 tablet (10 mg total) by mouth 3 (three) times daily as needed for muscle spasms. 04/30/17  Yes English, Stephanie D, PA  fluticasone (FLONASE) 50 MCG/ACT nasal spray Place 2 sprays into both nostrils daily. 03/03/16  Yes English, Stephanie D, PA  Guaifenesin (MUCINEX MAXIMUM STRENGTH) 1200 MG TB12 Take 1 tablet (1,200 mg total) by mouth every 12 (twelve) hours as needed. 03/03/16  Yes English, Stephanie D, PA  SYNTHROID 75 MCG tablet TAKE 1 TABLET (75 MCG TOTAL) DAILY BY MOUTH. OFFICE VISIT NEEDED FOR REFILLS 09/09/17  Yes English, Stephanie D, PA  HYDROcodone-acetaminophen (NORCO) 5-325 MG tablet Take 1 tablet by mouth every 6 (six) hours as needed for moderate pain. Patient not taking: Reported on 09/18/2017 09/13/17   Horald Pollen, MD  mesalamine (APRISO) 0.375 g 24 hr capsule Take 4 capsules (1.5 g total) by mouth daily. 08/27/16 10/17/16  Mauri Pole, MD  predniSONE (DELTASONE) 20 MG tablet Take 2 tablets (40 mg total) by mouth daily with breakfast for 5 days. Patient not taking: Reported on 09/18/2017 09/13/17 09/18/17  Horald Pollen, MD    No Known Allergies  Patient Active Problem List   Diagnosis Date Noted  . 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 Tenosynovitis of thumb 05/27/2012    Past Medical History:  Diagnosis Date  . Allergy   . Chronic colitis   . Clotting disorder ([place])    blood clot from skiing accident in l arm  .  Hyperlipidemia   . Hyperplastic colon polyp   . Hypothyroidism     Past Surgical History:  Procedure Laterality Date  . BREAST BIOPSY Left 1989  . TRIGGER FINGER RELEASE  06/13/2012   Procedure: RELEASE TRIGGER FINGER/A-1 PULLEY;  Surgeon: Wynonia Sours, MD;  Location: [facility];  Service: Orthopedics;  Laterality: Right;  trigger release right thumb     Social History   Socioeconomic History  . Marital status: Single    Spouse name: Not on file  . Number of children: 3  . Years of education: Not on file  . Highest education level: Not on file  Social Needs  . Financial resource strain: Not on file  . Food insecurity - worry: Not on file  . Food insecurity - inability: Not on file  . Transportation needs - medical: Not on file  . Transportation needs - non-medical: Not on file  Occupational History  . 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 Smoking status: Never Smoker  . Smokeless tobacco: Never Used  Substance and Sexual Activity  . Alcohol use: Yes    Comment: rare  . Drug use: No  . Sexual activity: Yes  Other Topics Concern  . Not on file  Social History Narrative  . Not on file    Family History  Problem Relation Age of Onset  . Stroke Mother   . Heart disease Mother   .  Lung cancer Father   . Heart disease Brother   . Colon cancer Maternal Grandmother 59  . Breast cancer Paternal Aunt   . Esophageal cancer Neg Hx   . Rectal cancer Neg Hx   . Stomach cancer Neg Hx      Review of Systems  Constitutional: Negative for chills and fever.  HENT: Negative for ear pain, hearing loss and sore throat.   Eyes: Negative for blurred vision and double vision.  Respiratory: Negative for cough and shortness of breath.   Cardiovascular: Negative for chest pain.  Gastrointestinal: Negative for abdominal pain, nausea and vomiting.  Skin: Negative for rash.  Neurological: Negative for dizziness, sensory change, speech change, focal weakness and  headaches.  Endo/Heme/Allergies: Negative.   All other systems reviewed and are negative.   Vitals:   09/18/17 1505  BP: 110/72  Pulse: 70  Resp: 16  Temp: 98.8 F (37.1 C)  SpO2: 96%    Physical Exam  Constitutional: She is oriented to person, place, and time. She appears well-developed and well-nourished.  HENT:  Head: Normocephalic and atraumatic.  Eyes: EOM are normal. Pupils are equal, round, and reactive to light.  Neck: Normal range of motion. Neck supple.  Cardiovascular: Normal rate.  Pulmonary/Chest: Effort normal.  Neurological: She is alert and oriented to person, place, and time.  Skin: Skin is warm and dry. Capillary refill takes less than 2 seconds.  Psychiatric: She has a normal mood and affect. Her behavior is normal.  Vitals reviewed.    ASSESSMENT & PLAN: Kay was seen today for follow-up and medication refill.  Diagnoses and all orders for this visit:  Right sided facial pain Comments: gone  Facial pain syndrome Comments: improved   Patient Instructions       IF you received an x-ray today, you will receive an invoice from [facility]. Please contact [facility] at [phone] with questions or concerns regarding your invoice.   IF you received labwork today, you will receive an invoice from [facility]. Please contact [facility] at [phone] with questions or concerns regarding your invoice.   Our billing staff will not be able to assist you with questions regarding bills from these companies.  You will be contacted with the lab results as soon as they are available. The fastest way to get your results is to activate your My Chart account. Instructions are located on the last page of this paperwork. If you have not heard from [place] regarding the results in 2 weeks, please contact this office.    Health Maintenance, Female Adopting a healthy lifestyle and getting preventive care can go a long way to promote health and  wellness. Talk with your health care provider about what schedule of regular examinations is right for you. This is a good chance for you to check in with your provider about disease prevention and staying healthy. In between checkups, there are plenty of things you can do on your own. Experts have done a lot of research about which lifestyle changes and preventive measures are most likely to keep you healthy. Ask your health care provider for more information. Weight and diet Eat a healthy diet  Be sure to include plenty of vegetables, fruits, low-fat dairy products, and lean protein.  Do not eat a lot of foods high in solid fats, added sugars, or salt.  Get regular exercise. This is one of the most important things you can do for your health. ? Most adults should exercise for at least  150 minutes each week. The exercise should increase your heart rate and make you sweat (moderate-intensity exercise). ? Most adults should also do strengthening exercises at least twice a week. This is in addition to the moderate-intensity exercise.  Maintain a healthy weight  Body mass index (BMI) is a measurement that can be used to identify possible weight problems. It estimates body fat based on height and weight. Your health care provider can help determine your BMI and help you achieve or maintain a healthy weight.  For females 69 years of age and older: ? A BMI below 18.5 is considered underweight. ? A BMI of 18.5 to 24.9 is normal. ? A BMI of 25 to 29.9 is considered overweight. ? A BMI of 30 and above is considered obese.  Watch levels of cholesterol and blood lipids  You should start having your blood tested for lipids and cholesterol at 61 years of age, then have this test every 5 years.  You may need to have your cholesterol levels checked more often if: ? Your lipid or cholesterol levels are high. ? You are older than 61 years of age. ? You are at high risk for heart disease.  Cancer  screening Lung Cancer  Lung cancer screening is recommended for adults 65-38 years old who are at high risk for lung cancer because of a history of smoking.  A yearly low-dose CT scan of the lungs is recommended for people who: ? Currently smoke. ? Have quit within the past 15 years. ? Have at least a 30-pack-year history of smoking. A pack year is smoking an average of one pack of cigarettes a day for 1 year.  Yearly screening should continue until it has been 15 years since you quit.  Yearly screening should stop if you develop a health problem that would prevent you from having lung cancer treatment.  Breast Cancer  Practice breast self-awareness. This means understanding how your breasts normally appear and feel.  It also means doing regular breast self-exams. Let your health care provider know about any changes, no matter how small.  If you are in your 20s or 30s, you should have a clinical breast exam (CBE) by a health care provider every 1-3 years as part of a regular health exam.  If you are 29 or older, have a CBE every year. Also consider having a breast X-ray (mammogram) every year.  If you have a family history of breast cancer, talk to your health care provider about genetic screening.  If you are at high risk for breast cancer, talk to your health care provider about having an MRI and a mammogram every year.  Breast cancer gene (BRCA) assessment is recommended for women who have family members with BRCA-related cancers. BRCA-related cancers include: ? Breast. ? Ovarian. ? Tubal. ? Peritoneal cancers.  Results of the assessment will determine the need for genetic counseling and BRCA1 and BRCA2 testing.  Cervical Cancer Your health care provider may recommend that you be screened regularly for cancer of the pelvic organs (ovaries, uterus, and vagina). This screening involves a pelvic examination, including checking for microscopic changes to the surface of your cervix  (Pap test). You may be encouraged to have this screening done every 3 years, beginning at age 59.  For women ages 91-65, health care providers may recommend pelvic exams and Pap testing every 3 years, or they may recommend the Pap and pelvic exam, combined with testing for human papilloma virus (HPV), every 5 years. Some  types of HPV increase your risk of cervical cancer. Testing for HPV may also be done on women of any age with unclear Pap test results.  Other health care providers may not recommend any screening for nonpregnant women who are considered low risk for pelvic cancer and who do not have symptoms. Ask your health care provider if a screening pelvic exam is right for you.  If you have had past treatment for cervical cancer or a condition that could lead to cancer, you need Pap tests and screening for cancer for at least 20 years after your treatment. If Pap tests have been discontinued, your risk factors (such as having a new sexual partner) need to be reassessed to determine if screening should resume. Some women have medical problems that increase the chance of getting cervical cancer. In these cases, your health care provider may recommend more frequent screening and Pap tests.  Colorectal Cancer  This type of cancer can be detected and often prevented.  Routine colorectal cancer screening usually begins at 61 years of age and continues through 61 years of age.  Your health care provider may recommend screening at an earlier age if you have risk factors for colon cancer.  Your health care provider may also recommend using home test kits to check for hidden blood in the stool.  A small camera at the end of a tube can be used to examine your colon directly (sigmoidoscopy or colonoscopy). This is done to check for the earliest forms of colorectal cancer.  Routine screening usually begins at age 69.  Direct examination of the colon should be repeated every 5-10 years through 61 years  of age. However, you may need to be screened more often if early forms of precancerous polyps or small growths are found.  Skin Cancer  Check your skin from head to toe regularly.  Tell your health care provider about any new moles or changes in moles, especially if there is a change in a mole's shape or color.  Also tell your health care provider if you have a mole that is larger than the size of a pencil eraser.  Always use sunscreen. Apply sunscreen liberally and repeatedly throughout the day.  Protect yourself by wearing long sleeves, pants, a wide-brimmed hat, and sunglasses whenever you are outside.  Heart disease, diabetes, and high blood pressure  High blood pressure causes heart disease and increases the risk of stroke. High blood pressure is more likely to develop in: ? People who have blood pressure in the high end of the normal range (130-139/85-89 mm Hg). ? People who are overweight or obese. ? People who are African American.  If you are 88-51 years of age, have your blood pressure checked every 3-5 years. If you are 32 years of age or older, have your blood pressure checked every year. You should have your blood pressure measured twice-once when you are at a hospital or clinic, and once when you are not at a hospital or clinic. Record the average of the two measurements. To check your blood pressure when you are not at a hospital or clinic, you can use: ? An automated blood pressure machine at a pharmacy. ? A home blood pressure monitor.  If you are between 36 years and 54 years old, ask your health care provider if you should take aspirin to prevent strokes.  Have regular diabetes screenings. This involves taking a blood sample to check your fasting blood sugar level. ? If you are  at a normal weight and have a low risk for diabetes, have this test once every three years after 61 years of age. ? If you are overweight and have a high risk for diabetes, consider being tested  at a younger age or more often. Preventing infection Hepatitis B  If you have a higher risk for hepatitis B, you should be screened for this virus. You are considered at high risk for hepatitis B if: ? You were born in a country where hepatitis B is common. Ask your health care provider which countries are considered high risk. ? Your parents were born in a high-risk country, and you have not been immunized against hepatitis B (hepatitis B vaccine). ? You have HIV or AIDS. ? You use needles to inject street drugs. ? You live with someone who has hepatitis B. ? You have had sex with someone who has hepatitis B. ? You get hemodialysis treatment. ? You take certain medicines for conditions, including cancer, organ transplantation, and autoimmune conditions.  Hepatitis C  Blood testing is recommended for: ? Everyone born from [birth year] through 1965. ? Anyone with known risk factors for hepatitis C.  Sexually transmitted infections (STIs)  You should be screened for sexually transmitted infections (STIs) including gonorrhea and chlamydia if: ? You are sexually active and are younger than 61 years of age. ? You are older than 61 years of age and your health care provider tells you that you are at risk for this type of infection. ? Your sexual activity has changed since you were last screened and you are at an increased risk for chlamydia or gonorrhea. Ask your health care provider if you are at risk.  If you do not have HIV, but are at risk, it may be recommended that you take a prescription medicine daily to prevent HIV infection. This is called pre-exposure prophylaxis (PrEP). You are considered at risk if: ? You are sexually active and do not regularly use condoms or know the HIV status of your partner(s). ? You take drugs by injection. ? You are sexually active with a partner who has HIV.  Talk with your health care provider about whether you are at high risk of being infected with HIV. If  you choose to begin PrEP, you should first be tested for HIV. You should then be tested every 3 months for as long as you are taking PrEP. Pregnancy  If you are premenopausal and you may become pregnant, ask your health care provider about preconception counseling.  If you may become pregnant, take 400 to 800 micrograms (mcg) of folic acid every day.  If you want to prevent pregnancy, talk to your health care provider about birth control (contraception). Osteoporosis and menopause  Osteoporosis is a disease in which the bones lose minerals and strength with aging. This can result in serious bone fractures. Your risk for osteoporosis can be identified using a bone density scan.  If you are 23 years of age or older, or if you are at risk for osteoporosis and fractures, ask your health care provider if you should be screened.  Ask your health care provider whether you should take a calcium or vitamin D supplement to lower your risk for osteoporosis.  Menopause may have certain physical symptoms and risks.  Hormone replacement therapy may reduce some of these symptoms and risks. Talk to your health care provider about whether hormone replacement therapy is right for you. Follow these instructions at home:  Schedule regular health,  dental, and eye exams.  Stay current with your immunizations.  Do not use any tobacco products including cigarettes, chewing tobacco, or electronic cigarettes.  If you are pregnant, do not drink alcohol.  If you are breastfeeding, limit how much and how often you drink alcohol.  Limit alcohol intake to no more than 1 drink per day for nonpregnant women. One drink equals 12 ounces of beer, 5 ounces of wine, or 1 ounces of hard liquor.  Do not use street drugs.  Do not share needles.  Ask your health care provider for help if you need support or information about quitting drugs.  Tell your health care provider if you often feel depressed.  Tell your  health care provider if you have ever been abused or do not feel safe at home. This information is not intended to replace advice given to you by your health care provider. Make sure you discuss any questions you have with your health care provider. Document Released: 04/09/2011 Document Revised: 03/01/2016 Document Reviewed: 06/28/2015 Elsevier Interactive Patient Education  2018 Elsevier Inc.      Agustina Caroli, MD [facility]

## 2017-10-06 ENCOUNTER — Other Ambulatory Visit: Payer: Self-pay | Admitting: Physician Assistant

## 2017-10-07 NOTE — Telephone Encounter (Signed)
Refill request for Synthroid / On 09/08/17 note states patient needs to have F/U with Ivar Drape for further refills / LOV 09/18/17 with Dr. Mitchel Honour / Please process this refill is possible.

## 2017-10-15 ENCOUNTER — Other Ambulatory Visit: Payer: Self-pay | Admitting: Emergency Medicine

## 2017-10-15 DIAGNOSIS — J019 Acute sinusitis, unspecified: Secondary | ICD-10-CM

## 2017-10-19 ENCOUNTER — Other Ambulatory Visit: Payer: Self-pay | Admitting: Emergency Medicine

## 2017-10-19 DIAGNOSIS — J019 Acute sinusitis, unspecified: Secondary | ICD-10-CM

## 2017-10-28 ENCOUNTER — Other Ambulatory Visit: Payer: Self-pay | Admitting: Physician Assistant

## 2017-11-14 ENCOUNTER — Other Ambulatory Visit: Payer: Self-pay | Admitting: Physician Assistant

## 2017-11-16 ENCOUNTER — Other Ambulatory Visit: Payer: Self-pay | Admitting: Physician Assistant

## 2017-11-18 ENCOUNTER — Other Ambulatory Visit: Payer: Self-pay | Admitting: Emergency Medicine

## 2017-11-18 DIAGNOSIS — J019 Acute sinusitis, unspecified: Secondary | ICD-10-CM

## 2017-11-28 ENCOUNTER — Telehealth: Payer: Self-pay | Admitting: Physician Assistant

## 2017-11-28 NOTE — Telephone Encounter (Signed)
Copied from [place]. Topic: Quick Communication - Rx Refill/Question >> Nov 28, 2017  3:49 PM Robina Ade, Helene Kelp D wrote: Medication: levothyroxine (SYNTHROID, LEVOTHROID) 75 MCG tablet Has the patient contacted their pharmacy? Yes (Agent: If no, request that the patient contact the pharmacy for the refill.) Preferred Pharmacy (with phone number or street name): [facility], [place]: Please be advised that RX refills may take up to 3 business days. We ask that you follow-up with your pharmacy.  Patient has appt on 12/05/17 for CPE, if she can get enough to last her until her next appt.

## 2017-11-29 ENCOUNTER — Other Ambulatory Visit: Payer: Self-pay

## 2017-11-29 MED ORDER — LEVOTHYROXINE SODIUM 75 MCG PO TABS: ORAL_TABLET | ORAL | 2 refills | Status: DC

## 2017-12-04 ENCOUNTER — Ambulatory Visit: Payer: BC Managed Care – PPO | Admitting: Physician Assistant

## 2017-12-04 ENCOUNTER — Encounter: Payer: Self-pay | Admitting: Physician Assistant

## 2017-12-04 VITALS — BP 135/88 | HR 77 | Temp 98.8°F | Resp 16 | Ht 65.5 in | Wt 191.0 lb

## 2017-12-04 DIAGNOSIS — Z1329 Encounter for screening for other suspected endocrine disorder: Secondary | ICD-10-CM

## 2017-12-04 DIAGNOSIS — R05 Cough: Secondary | ICD-10-CM | POA: Diagnosis not present

## 2017-12-04 DIAGNOSIS — Z1231 Encounter for screening mammogram for malignant neoplasm of breast: Secondary | ICD-10-CM | POA: Diagnosis not present

## 2017-12-04 DIAGNOSIS — Z13228 Encounter for screening for other metabolic disorders: Secondary | ICD-10-CM | POA: Diagnosis not present

## 2017-12-04 DIAGNOSIS — Z130 Encounter for screening for diseases of the blood and blood-forming organs and certain disorders involving the immune mechanism: Secondary | ICD-10-CM

## 2017-12-04 DIAGNOSIS — Z0000 Encounter for general adult medical examination without abnormal findings: Secondary | ICD-10-CM | POA: Diagnosis not present

## 2017-12-04 DIAGNOSIS — Z13220 Encounter for screening for lipoid disorders: Secondary | ICD-10-CM | POA: Diagnosis not present

## 2017-12-04 DIAGNOSIS — R059 Cough, unspecified: Secondary | ICD-10-CM

## 2017-12-04 DIAGNOSIS — J0140 Acute pansinusitis, unspecified: Secondary | ICD-10-CM | POA: Diagnosis not present

## 2017-12-04 DIAGNOSIS — Z1239 Encounter for other screening for malignant neoplasm of breast: Secondary | ICD-10-CM

## 2017-12-04 MED ORDER — HYDROCOD POLST-CPM POLST ER 10-8 MG/5ML PO SUER: 5.0000 mL | Freq: Every evening | ORAL | 0 refills | Status: DC | PRN

## 2017-12-04 MED ORDER — AMOXICILLIN-POT CLAVULANATE 875-125 MG PO TABS: 1.0000 | ORAL_TABLET | Freq: Two times a day (BID) | ORAL | 0 refills | Status: AC

## 2017-12-04 MED ORDER — IPRATROPIUM BROMIDE 0.03 % NA SOLN: 2.0000 | Freq: Two times a day (BID) | NASAL | 0 refills | Status: DC

## 2017-12-04 NOTE — Patient Instructions (Addendum)
Please await contact for the mammogram.   Please exercise 4 times per week with 30 minutes of aerobic activity.  After you feel better! Please make sure you are hydrating well especially while you are ill.   Take the antibiotic to completion.   Sinusitis, Adult Sinusitis is soreness and inflammation of your sinuses. Sinuses are hollow spaces in the bones around your face. They are located:  Around your eyes.  In the middle of your forehead.  Behind your nose.  In your cheekbones.  Your sinuses and nasal passages are lined with a stringy fluid (mucus). Mucus normally drains out of your sinuses. When your nasal tissues get inflamed or swollen, the mucus can get trapped or blocked so air cannot flow through your sinuses. This lets bacteria, viruses, and funguses grow, and that leads to infection. Follow these instructions at home: Medicines  Take, use, or apply over-the-counter and prescription medicines only as told by your doctor. These may include nasal sprays.  If you were prescribed an antibiotic medicine, take it as told by your doctor. Do not stop taking the antibiotic even if you start to feel better. Hydrate and Humidify  Drink enough water to keep your pee (urine) clear or pale yellow.  Use a cool mist humidifier to keep the humidity level in your home above 50%.  Breathe in steam for 10-15 minutes, 3-4 times a day or as told by your doctor. You can do this in the bathroom while a hot shower is running.  Try not to spend time in cool or dry air. Rest  Rest as much as possible.  Sleep with your head raised (elevated).  Make sure to get enough sleep each night. General instructions  Put a warm, moist washcloth on your face 3-4 times a day or as told by your doctor. This will help with discomfort.  Wash your hands often with soap and water. If there is no soap and water, use hand sanitizer.  Do not smoke. Avoid being around people who are smoking (secondhand  smoke).  Keep all follow-up visits as told by your doctor. This is important. Contact a doctor if:  You have a fever.  Your symptoms get worse.  Your symptoms do not get better within 10 days. Get help right away if:  You have a very bad headache.  You cannot stop throwing up (vomiting).  You have pain or swelling around your face or eyes.  You have trouble seeing.  You feel confused.  Your neck is stiff.  You have trouble breathing. This information is not intended to replace advice given to you by your health care provider. Make sure you discuss any questions you have with your health care provider. Document Released: 03/12/2008 Document Revised: 05/20/2016 Document Reviewed: 07/20/2015 Elsevier Interactive Patient Education  2018 [place] [employer]  Get These Tests  Blood Pressure- Have your blood pressure checked by your healthcare provider at least once a year.  Normal blood pressure is 120/80.  Weight- Have your body mass index (BMI) calculated to screen for obesity.  BMI is a measure of body fat based on height and weight.  You can calculate your own BMI at [employer].it  Cholesterol- Have your cholesterol checked every year.  Diabetes- Have your blood sugar checked every year if you have high blood pressure, high cholesterol, a family history of diabetes or if you are overweight.  Pap Test - Have a pap test every 1 to 5 years if you have  been sexually active.  If you are older than 65 and recent pap tests have been normal you may not need additional pap tests.  In addition, if you have had a hysterectomy  for benign disease additional pap tests are not necessary.  Mammogram-Yearly mammograms are essential for early detection of breast cancer  Screening for Colon Cancer- Colonoscopy starting at age 39. Screening may begin sooner depending on your family history and other health conditions.  Follow up colonoscopy as directed by your  Gastroenterologist.  Screening for Osteoporosis- Screening begins at age 16 with bone density scanning, sooner if you are at higher risk for developing Osteoporosis.  Get these medicines  Calcium with Vitamin D- Your body requires 1200-1500 mg of Calcium a day and [id number] IU of Vitamin D a day.  You can only absorb 500 mg of Calcium at a time therefore Calcium must be taken in 2 or 3 separate doses throughout the day.  Hormones- Hormone therapy has been associated with increased risk for certain cancers and heart disease.  Talk to your healthcare provider about if you need relief from menopausal symptoms.  Aspirin- Ask your healthcare provider about taking Aspirin to prevent Heart Disease and Stroke.  Get these Immuniztions  Flu shot- Every fall  Pneumonia shot- Once after the age of 24; if you are younger ask your healthcare provider if you need a pneumonia shot.  Tetanus- Every ten years.  Zostavax- Once after the age of 41 to prevent shingles.  Take these steps  Don't smoke- Your healthcare provider can help you quit. For tips on how to quit, ask your healthcare provider or go to www.smokefree.gov or call 1-800 QUIT-NOW.  Be physically active- Exercise 5 days a week for a minimum of 30 minutes.  If you are not already physically active, start slow and gradually work up to 30 minutes of moderate physical activity.  Try walking, dancing, bike riding, swimming, etc.  Eat a healthy diet- Eat a variety of healthy foods such as fruits, vegetables, whole grains, low fat milk, low fat cheeses, yogurt, lean meats, chicken, fish, eggs, dried beans, tofu, etc.  For more information go to www.thenutritionsource.org  Dental visit- Brush and floss teeth twice daily; visit your dentist twice a year.  Eye exam- Visit your Optometrist or Ophthalmologist yearly.  Drink alcohol in moderation- Limit alcohol intake to one drink or less a day.  Never drink and drive.  Depression- Your emotional  health is as important as your physical health.  If you're feeling down or losing interest in things you normally enjoy, please talk to your healthcare provider.  Seat Belts- can save your life; always wear one  Smoke/Carbon Monoxide detectors- These detectors need to be installed on the appropriate level of your home.  Replace batteries at least once a year.  Violence- If anyone is threatening or hurting you, please tell your healthcare provider.  Living Will/ Health care power of attorney- Discuss with your healthcare provider and family.   IF you received an x-ray today, you will receive an invoice from [facility]. Please contact [facility] at [phone] with questions or concerns regarding your invoice.   IF you received labwork today, you will receive an invoice from [place]. Please contact [facility] at [phone] with questions or concerns regarding your invoice.   Our billing staff will not be able to assist you with questions regarding bills from these companies.  You will be contacted with the lab results as soon as they are available.  The fastest way to get your results is to activate your My Chart account. Instructions are located on the last page of this paperwork. If you have not heard from [place] regarding the results in 2 weeks, please contact this office.

## 2017-12-04 NOTE — Progress Notes (Signed)
PRIMARY CARE AT [facility] [address] [place] [address] [phone]  Date:  12/04/2017   Name:  Alisha Welch   DOB:  04-26-1956   MRN:  [id number]  PCP:  Joretta Bachelor, PA    History of Present Illness:  Alisha Welch is a 62 y.o. female patient who presents to PCP with  Chief Complaint  Patient presents with  . Sinus Problem    x 1 week  . Sore Throat  . Cough    worse at night  . Nasal Congestion      DIET: salads, vegetables daily.  She is eating fried foods.  Water intake: she drinks water during the day, but 16 oz.  Caffeine intake: minimal  BM: normal.  No blood or black stool.  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    HYSTERECTOMY  Patient is here with... A little over a week--nasal congestion, itchy throat, and coughing.  Cough is worse at night.  Productive only one day.  No sob or dyspnea.   No fever.  She has taken sudafed--which helped minimally.   She took nyquil one night.    Health Maintenance  Topic Date Due  . Hepatitis C Screening  Jul 07, 1956  . HIV Screening  02/20/1971  . PAP SMEAR  02/19/1977  . INFLUENZA VACCINE  01/05/2018 (Originally 05/08/2017)  . MAMMOGRAM  03/01/2018  . COLONOSCOPY  08/17/2021  . TETANUS/TDAP  10/27/2025    Patient Active Problem List   Diagnosis Date Noted  . 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 Tenosynovitis of thumb 05/27/2012    Past Medical History:  Diagnosis Date  . Allergy   . Chronic colitis   . Clotting disorder ([place])    blood clot from skiing accident in l arm  . Hyperlipidemia   . Hyperplastic colon polyp   . Hypothyroidism     Past Surgical History:  Procedure Laterality Date  . BREAST BIOPSY Left 1989  . TRIGGER FINGER RELEASE  06/13/2012   Procedure: RELEASE TRIGGER FINGER/A-1 PULLEY;  Surgeon: Wynonia Sours, MD;  Location: [place];  Service: Orthopedics;  Laterality: Right;   trigger release right thumb     Social History   Tobacco Use  . Smoking status: Never Smoker  . Smokeless tobacco: Never Used  Substance Use Topics  . Alcohol use: Yes    Comment: rare  . Drug use: No    Family History  Problem Relation Age of Onset  . Stroke Mother   . Heart disease Mother   . Lung cancer Father   . Heart disease Brother   . Colon cancer Maternal Grandmother 88  . Breast cancer Paternal Aunt   . Esophageal cancer Neg Hx   . Rectal cancer Neg Hx   . Stomach cancer Neg Hx     No Known Allergies  Medication list has been reviewed and updated.  Current Outpatient Medications on File Prior to Visit  Medication Sig Dispense Refill  . fluticasone (FLONASE) 50 MCG/ACT nasal spray SPRAY 2 SPRAYS INTO EACH NOSTRIL EVERY DAY 17 g 0  . levothyroxine (SYNTHROID, LEVOTHROID) 75 MCG tablet TAKE 1 TABLET (75 MCG TOTAL) DAILY BY MOUTH. OFFICE VISIT NEEDED FOR REFILLS 30 tablet 2  . HYDROcodone-acetaminophen (NORCO) 5-325 MG tablet Take 1 tablet by mouth every 6 (six) hours as needed for moderate pain. (Patient not taking: Reported on 09/18/2017) 15 tablet 0  Current Facility-Administered Medications on File Prior to Visit  Medication Dose Route Frequency Provider Last Rate Last Dose  . 0.9 %  sodium chloride infusion  500 mL Intravenous Continuous Nandigam, Kavitha V, MD        ROS ROS otherwise unremarkable unless listed above.  Physical Examination: BP 135/88   Pulse 77   Temp 98.8 F (37.1 C) (Oral)   Resp 16   Ht 5' 5.5" (1.664 m)   Wt 191 lb (86.6 kg)   SpO2 95%   BMI 31.30 kg/m  Ideal Body Weight: Weight in (lb) to have BMI = 25: 152.2  Physical Exam  Constitutional: She is oriented to person, place, and time. She appears well-developed and well-nourished. No distress.  HENT:  Head: Normocephalic and atraumatic.  Right Ear: Tympanic membrane, external ear and ear canal normal.  Left Ear: Tympanic membrane, external ear and ear canal normal.   Nose: Mucosal edema and rhinorrhea present. Right sinus exhibits maxillary sinus tenderness and frontal sinus tenderness. Left sinus exhibits maxillary sinus tenderness and frontal sinus tenderness.  Mouth/Throat: Oropharynx is clear and moist. No uvula swelling. No oropharyngeal exudate, posterior oropharyngeal edema or posterior oropharyngeal erythema.  Eyes: Conjunctivae and EOM are normal. Pupils are equal, round, and reactive to light.  Neck: Normal range of motion. Neck supple. No thyromegaly present.  Cardiovascular: Normal rate, regular rhythm, normal heart sounds and intact distal pulses. Exam reveals no gallop, no distant heart sounds and no friction rub.  No murmur heard. Pulmonary/Chest: Effort normal and breath sounds normal. No respiratory distress. She has no decreased breath sounds. She has no wheezes. She has no rhonchi.  Abdominal: Soft. Bowel sounds are normal. She exhibits no distension and no mass. There is no tenderness.  Musculoskeletal: Normal range of motion. She exhibits no edema or tenderness.  Lymphadenopathy:       Head (right side): No submandibular, no tonsillar, no preauricular and no posterior auricular adenopathy present.       Head (left side): No submandibular, no tonsillar, no preauricular and no posterior auricular adenopathy present.    She has no cervical adenopathy.  Neurological: She is alert and oriented to person, place, and time. No cranial nerve deficit. She exhibits normal muscle tone. Coordination normal.  Skin: Skin is warm and dry. She is not diaphoretic.  Psychiatric: She has a normal mood and affect. Her behavior is normal.     Assessment and Plan: Alisha Welch is a 62 y.o. female who is here today for a physical exam, and a cc of sinus symptoms. Advised hydration.  Treating for bacterial etiology at this time.   Annual physical exam - Plan: CBC, CMP14+EGFR, TSH, Lipid panel, MM SCREENING BREAST TOMO BILATERAL  Screening for deficiency  anemia - Plan: CBC  Screening for thyroid disorder - Plan: TSH  Need for lipid screening - Plan: Lipid panel  Screening for metabolic disorder - Plan: CMP14+EGFR  Screening for breast cancer - Plan: MM SCREENING BREAST TOMO BILATERAL  Subacute pansinusitis - Plan: amoxicillin-clavulanate (AUGMENTIN) 875-125 MG tablet, ipratropium (ATROVENT) 0.03 % nasal spray  Cough - Plan: chlorpheniramine-HYDROcodone (TUSSIONEX PENNKINETIC ER) 10-8 MG/5ML SUER  Ivar Drape, PA-C [facility] 2/27/201912:55 PM

## 2017-12-05 ENCOUNTER — Encounter: Payer: BC Managed Care – PPO | Admitting: Physician Assistant

## 2017-12-05 LAB — CMP14+EGFR
ALBUMIN: 4.3 g/dL (ref 3.6–4.8)
ALK PHOS: 130 IU/L — AB (ref 39–117)
ALT: 23 IU/L (ref 0–32)
AST: 23 IU/L (ref 0–40)
Albumin/Globulin Ratio: 1.6 (ref 1.2–2.2)
BUN / CREAT RATIO: 15 (ref 12–28)
BUN: 12 mg/dL (ref 8–27)
Bilirubin Total: 0.3 mg/dL (ref 0.0–1.2)
CO2: 23 mmol/L (ref 20–29)
Calcium: 9.7 mg/dL (ref 8.7–10.3)
Chloride: 106 mmol/L (ref 96–106)
Creatinine, Ser: 0.79 mg/dL (ref 0.57–1.00)
GFR calc Af Amer: 93 mL/min/{1.73_m2} (ref >59)
GFR calc non Af Amer: 81 mL/min/{1.73_m2} (ref >59)
GLUCOSE: 100 mg/dL — AB (ref 65–99)
Globulin, Total: 2.7 g/dL (ref 1.5–4.5)
Potassium: 4.6 mmol/L (ref 3.5–5.2)
Sodium: 144 mmol/L (ref 134–144)
Total Protein: 7 g/dL (ref 6.0–8.5)

## 2017-12-05 LAB — CBC
Hematocrit: 41.3 % (ref 34.0–46.6)
Hemoglobin: 13.2 g/dL (ref 11.1–15.9)
MCH: 31.5 pg (ref 26.6–33.0)
MCHC: 32 g/dL (ref 31.5–35.7)
MCV: 99 fL — ABNORMAL HIGH (ref 79–97)
PLATELETS: 412 10*3/uL — AB (ref 150–379)
RBC: 4.19 x10E6/uL (ref 3.77–5.28)
RDW: 13.1 % (ref 12.3–15.4)
WBC: 7.5 10*3/uL (ref 3.4–10.8)

## 2017-12-05 LAB — LIPID PANEL
CHOLESTEROL TOTAL: 216 mg/dL — AB (ref 100–199)
Chol/HDL Ratio: 2.9 ratio (ref 0.0–4.4)
HDL: 75 mg/dL (ref >39)
LDL Calculated: 121 mg/dL — ABNORMAL HIGH (ref 0–99)
Triglycerides: 102 mg/dL (ref 0–149)
VLDL Cholesterol Cal: 20 mg/dL (ref 5–40)

## 2017-12-05 LAB — TSH: TSH: 11.51 u[IU]/mL — AB (ref 0.450–4.500)

## 2017-12-19 ENCOUNTER — Ambulatory Visit: Payer: BC Managed Care – PPO | Admitting: Family Medicine

## 2017-12-19 ENCOUNTER — Other Ambulatory Visit: Payer: Self-pay

## 2017-12-19 ENCOUNTER — Encounter: Payer: Self-pay | Admitting: Family Medicine

## 2017-12-19 VITALS — BP 112/70 | HR 70 | Temp 98.4°F | Resp 16 | Ht 65.75 in | Wt 190.6 lb

## 2017-12-19 DIAGNOSIS — R21 Rash and other nonspecific skin eruption: Secondary | ICD-10-CM | POA: Diagnosis not present

## 2017-12-19 MED ORDER — VALACYCLOVIR HCL 1 G PO TABS: 1000.0000 mg | ORAL_TABLET | Freq: Three times a day (TID) | ORAL | 0 refills | Status: DC

## 2017-12-19 NOTE — Progress Notes (Signed)
3/14/201912:35 PM  Alisha Welch 10-23-1955, 62 y.o. female [id number]  Chief Complaint  Patient presents with  . Rash    Onset this morning, possible shingles. Hx of chickenpox. Rash is on right side. No new soaps/lotions/detergents. Rash has a burning pain, is itchy.    HPI:   Patient is a 62 y.o. female who presents today for a rash she noticed this morning. She did not have the rash last night. She reports a scattered rash along right side of torso, along mid axillary line. She reports it burns and is itchy. She denies use of any new cleaning/skin products. She reports h/o childhood chickenpox. She has never had this rash before. She denies any fever, chills or any other body part with rash. She does not think it is spreading.  Depression screen [facility] 2/9 12/19/2017 12/04/2017 09/13/2017  Decreased Interest 0 0 0  Down, Depressed, Hopeless 0 0 0  PHQ - 2 Score 0 0 0    No Known Allergies  Prior to Admission medications   Medication Sig Start Date End Date Taking? Authorizing Provider  fluticasone (FLONASE) 50 MCG/ACT nasal spray SPRAY 2 SPRAYS INTO EACH NOSTRIL EVERY DAY 11/19/17   Horald Pollen, MD  levothyroxine (SYNTHROID, LEVOTHROID) 75 MCG tablet TAKE 1 TABLET (75 MCG TOTAL) DAILY BY MOUTH. OFFICE VISIT NEEDED FOR REFILLS 11/29/17   Joretta Bachelor, PA    Past Medical History:  Diagnosis Date  . Allergy   . History of blood clot    Blood clot in left arm after skiing accident in 20's  . Hyperlipidemia   . Hyperplastic colon polyp   . Hypothyroidism     Past Surgical History:  Procedure Laterality Date  . BREAST BIOPSY Left 1989  . TRIGGER FINGER RELEASE  06/13/2012   Procedure: RELEASE TRIGGER FINGER/A-1 PULLEY;  Surgeon: Wynonia Sours, MD;  Location: [place];  Service: Orthopedics;  Laterality: Right;  trigger release right thumb     Social History   Tobacco Use  . Smoking status: Never Smoker  . Smokeless tobacco: Never Used    Substance Use Topics  . Alcohol use: Yes    Comment: rare    Family History  Problem Relation Age of Onset  . Stroke Mother   . Heart disease Mother   . Lung cancer Father   . Heart disease Brother   . Colon cancer Maternal Grandmother 30  . Breast cancer Paternal Aunt   . Esophageal cancer Neg Hx   . Rectal cancer Neg Hx   . Stomach cancer Neg Hx     ROS Per hpi  OBJECTIVE:  Blood pressure 112/70, pulse 70, temperature 98.4 F (36.9 C), resp. rate 16, height 5' 5.75" (1.67 m), weight 190 lb 9.6 oz (86.5 kg), SpO2 97 %.  Physical Exam  Gen: AAOx3, NAD Skin: mild scattered vesicles mildly erythematous along right torso, no fluid in them seen when cultured. No crusting over observed either.   ASSESSMENT and PLAN  1. Rash and nonspecific skin eruption Discussed with patient that I was not 100% sure that was shingles, it could also have been insect bite as vesicles not as clustered nor erythematous as I have normally seen with shingles. However given risk of pain and continued spread with shingles, will treat empirically and culture lesions. RTC precautions discussed.  - Virus culture  Other orders - valACYclovir (VALTREX) 1000 MG tablet; Take 1 tablet (1,000 mg total) by mouth 3 (three) times daily.  Return if symptoms worsen or fail to improve.    Rutherford Guys, MD Primary Care at [facility],  [address] Ph.  [phone] Fax [phone]

## 2017-12-19 NOTE — Patient Instructions (Signed)
     IF you received an x-ray today, you will receive an invoice from [facility]. Please contact [facility] at [phone] with questions or concerns regarding your invoice.   IF you received labwork today, you will receive an invoice from [facility]. Please contact [facility] at [phone] with questions or concerns regarding your invoice.   Our billing staff will not be able to assist you with questions regarding bills from these companies.  You will be contacted with the lab results as soon as they are available. The fastest way to get your results is to activate your My Chart account. Instructions are located on the last page of this paperwork. If you have not heard from us regarding the results in 2 weeks, please contact this office.     

## 2017-12-27 LAB — VIRUS CULTURE

## 2018-01-08 ENCOUNTER — Encounter: Payer: Self-pay | Admitting: Physician Assistant

## 2018-02-01 ENCOUNTER — Other Ambulatory Visit: Payer: Self-pay | Admitting: Physician Assistant

## 2018-02-01 DIAGNOSIS — E038 Other specified hypothyroidism: Secondary | ICD-10-CM

## 2018-02-04 ENCOUNTER — Ambulatory Visit: Payer: BC Managed Care – PPO

## 2018-02-04 DIAGNOSIS — E038 Other specified hypothyroidism: Secondary | ICD-10-CM

## 2018-02-04 NOTE — Progress Notes (Unsigned)
Pt here for a lab only visit. Pt did not see provider.

## 2018-02-05 ENCOUNTER — Encounter: Payer: Self-pay | Admitting: *Deleted

## 2018-02-05 LAB — TSH: TSH: 1.88 u[IU]/mL (ref 0.450–4.500)

## 2018-02-05 MED ORDER — LEVOTHYROXINE SODIUM 75 MCG PO TABS: ORAL_TABLET | ORAL | 1 refills | Status: DC

## 2018-05-19 ENCOUNTER — Other Ambulatory Visit: Payer: Self-pay | Admitting: Family Medicine

## 2018-05-19 ENCOUNTER — Ambulatory Visit
Admission: RE | Admit: 2018-05-19 | Discharge: 2018-05-19 | Disposition: A | Discharge status: Home / Self Care | Payer: BC Managed Care – PPO | Source: Ambulatory Visit | Attending: Family Medicine | Admitting: Family Medicine

## 2018-05-19 DIAGNOSIS — M25512 Pain in left shoulder: Secondary | ICD-10-CM

## 2018-08-07 ENCOUNTER — Telehealth: Payer: Self-pay | Admitting: Family Medicine

## 2018-08-07 ENCOUNTER — Other Ambulatory Visit: Payer: Self-pay

## 2018-08-07 MED ORDER — LEVOTHYROXINE SODIUM 75 MCG PO TABS: ORAL_TABLET | ORAL | 0 refills | Status: DC

## 2018-08-07 NOTE — Telephone Encounter (Unsigned)
Copied from [place]. Topic: Quick Communication - Rx Refill/Question >> Aug 07, 2018 10:02 AM Marin Olp L wrote: Medication: levothyroxine (SYNTHROID, LEVOTHROID) 75 MCG tablet  Has the patient contacted their pharmacy? Yes.   (Agent: If no, request that the patient contact the pharmacy for the refill.) (Agent: If yes, when and what did the pharmacy advise?)  Preferred Pharmacy (with phone number or street name): [facility], [address] [place] [address] Phone: [phone] Fax: [phone]  Agent: Please be advised that RX refills may take up to 3 business days. We ask that you follow-up with your pharmacy.

## 2019-03-20 ENCOUNTER — Other Ambulatory Visit: Payer: Self-pay | Admitting: Family Medicine

## 2019-03-20 DIAGNOSIS — Z1231 Encounter for screening mammogram for malignant neoplasm of breast: Secondary | ICD-10-CM

## 2019-04-06 ENCOUNTER — Ambulatory Visit
Admission: RE | Admit: 2019-04-06 | Discharge: 2019-04-06 | Disposition: A | Discharge status: Home / Self Care | Payer: BC Managed Care – PPO | Source: Ambulatory Visit | Attending: Family Medicine | Admitting: Family Medicine

## 2019-04-06 ENCOUNTER — Other Ambulatory Visit: Payer: Self-pay

## 2019-04-06 ENCOUNTER — Other Ambulatory Visit: Payer: Self-pay | Admitting: Family Medicine

## 2019-04-06 DIAGNOSIS — Z1231 Encounter for screening mammogram for malignant neoplasm of breast: Secondary | ICD-10-CM

## 2019-10-20 ENCOUNTER — Ambulatory Visit: Payer: BC Managed Care – PPO | Attending: Internal Medicine

## 2019-10-20 DIAGNOSIS — Z20822 Contact with and (suspected) exposure to covid-19: Secondary | ICD-10-CM

## 2019-10-22 LAB — NOVEL CORONAVIRUS, NAA: SARS-CoV-2, NAA: DETECTED — AB

## 2020-11-23 ENCOUNTER — Other Ambulatory Visit: Payer: Self-pay | Admitting: Obstetrics & Gynecology

## 2020-11-23 DIAGNOSIS — Z1231 Encounter for screening mammogram for malignant neoplasm of breast: Secondary | ICD-10-CM

## 2020-11-26 ENCOUNTER — Ambulatory Visit
Admission: RE | Admit: 2020-11-26 | Discharge: 2020-11-26 | Disposition: A | Discharge status: Home / Self Care | Payer: BC Managed Care – PPO | Source: Ambulatory Visit | Attending: Obstetrics & Gynecology | Admitting: Obstetrics & Gynecology

## 2020-11-26 ENCOUNTER — Other Ambulatory Visit: Payer: Self-pay

## 2020-11-26 DIAGNOSIS — Z1231 Encounter for screening mammogram for malignant neoplasm of breast: Secondary | ICD-10-CM

## 2020-12-02 ENCOUNTER — Other Ambulatory Visit: Payer: Self-pay | Admitting: Obstetrics & Gynecology

## 2020-12-02 DIAGNOSIS — R928 Other abnormal and inconclusive findings on diagnostic imaging of breast: Secondary | ICD-10-CM

## 2020-12-20 ENCOUNTER — Other Ambulatory Visit: Payer: Self-pay | Admitting: Obstetrics & Gynecology

## 2020-12-20 ENCOUNTER — Other Ambulatory Visit: Payer: Self-pay

## 2020-12-20 ENCOUNTER — Ambulatory Visit
Admission: RE | Admit: 2020-12-20 | Discharge: 2020-12-20 | Disposition: A | Discharge status: Home / Self Care | Payer: BC Managed Care – PPO | Source: Ambulatory Visit | Attending: Obstetrics & Gynecology | Admitting: Obstetrics & Gynecology

## 2020-12-20 ENCOUNTER — Ambulatory Visit: Payer: BC Managed Care – PPO

## 2020-12-20 DIAGNOSIS — R928 Other abnormal and inconclusive findings on diagnostic imaging of breast: Secondary | ICD-10-CM

## 2020-12-29 ENCOUNTER — Ambulatory Visit
Admission: RE | Admit: 2020-12-29 | Discharge: 2020-12-29 | Disposition: A | Discharge status: Home / Self Care | Payer: BC Managed Care – PPO | Source: Ambulatory Visit | Attending: Obstetrics & Gynecology | Admitting: Obstetrics & Gynecology

## 2020-12-29 ENCOUNTER — Other Ambulatory Visit: Payer: Self-pay

## 2020-12-29 DIAGNOSIS — R928 Other abnormal and inconclusive findings on diagnostic imaging of breast: Secondary | ICD-10-CM

## 2020-12-29 HISTORY — PX: BREAST BIOPSY: SHX20

## 2021-04-30 IMAGING — MG MM BREAST BX W/ LOC DEV 1ST LESION IMAGE BX SPEC STEREO GUIDE*R*
8 of 10 series · 8 of 22 positions shown · non-contrast
Comparison: Previous exams.
COMPARISON: Previous exams.

Addendum:
CLINICAL DATA: Patient presents for stereotactic core needle biopsy
of a small group of calcifications in the posterior upper outer
right breast.

EXAM:
RIGHT BREAST STEREOTACTIC CORE NEEDLE BIOPSY

[R (1 of 6)]
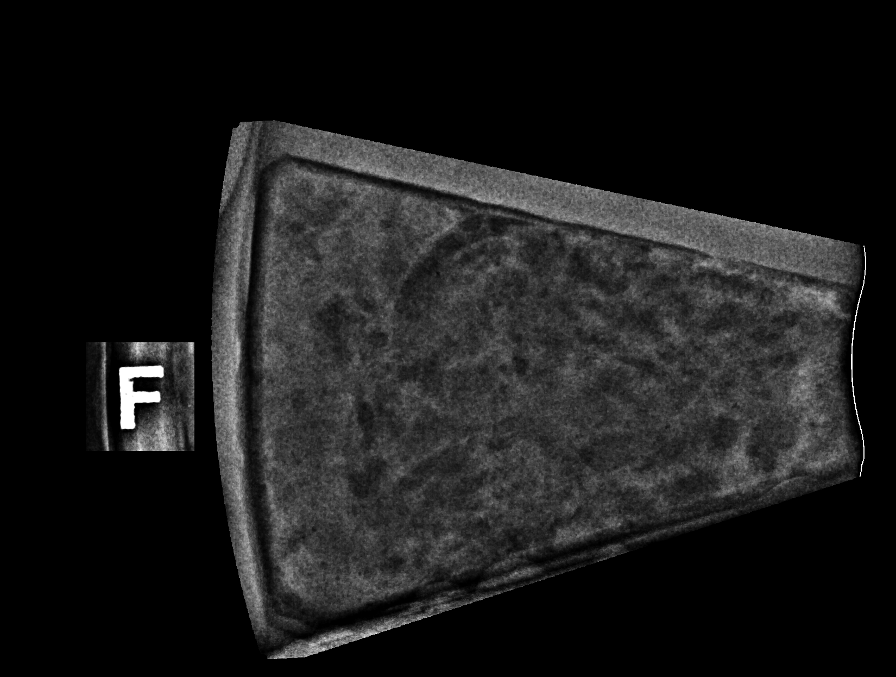

[R (2 of 6)]
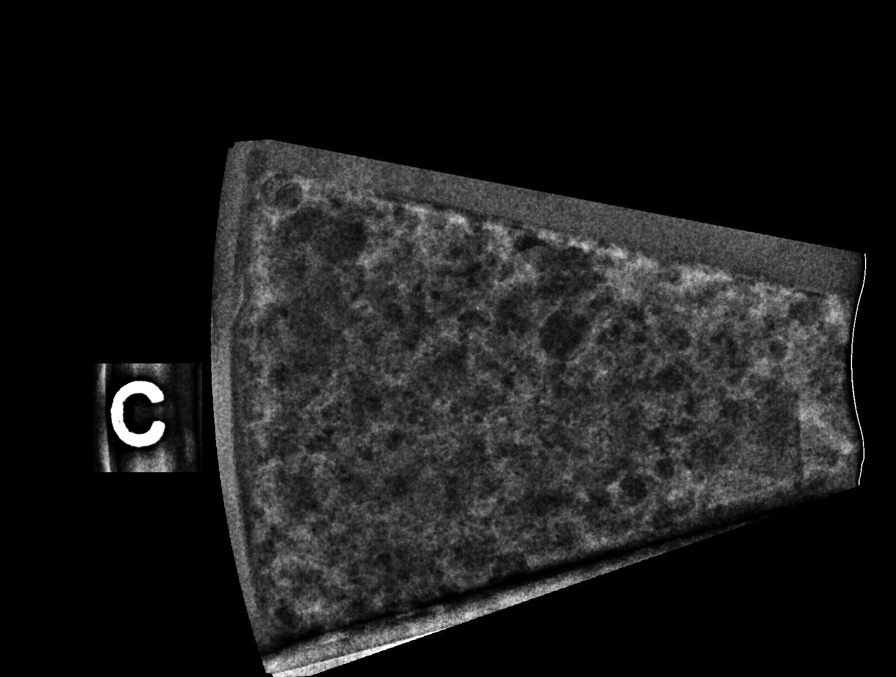

[R (3 of 6)]
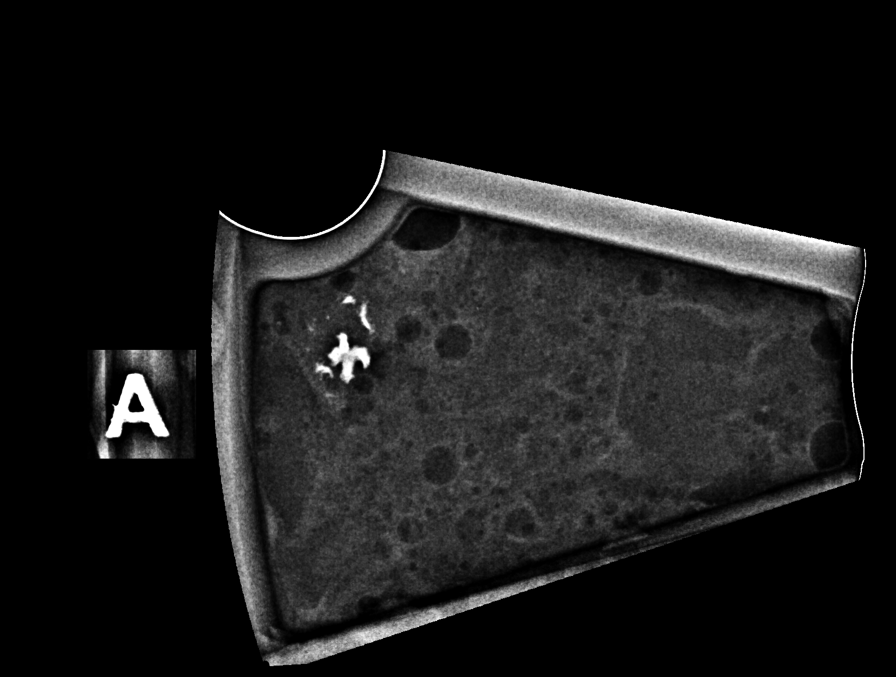

[R (4 of 6)]
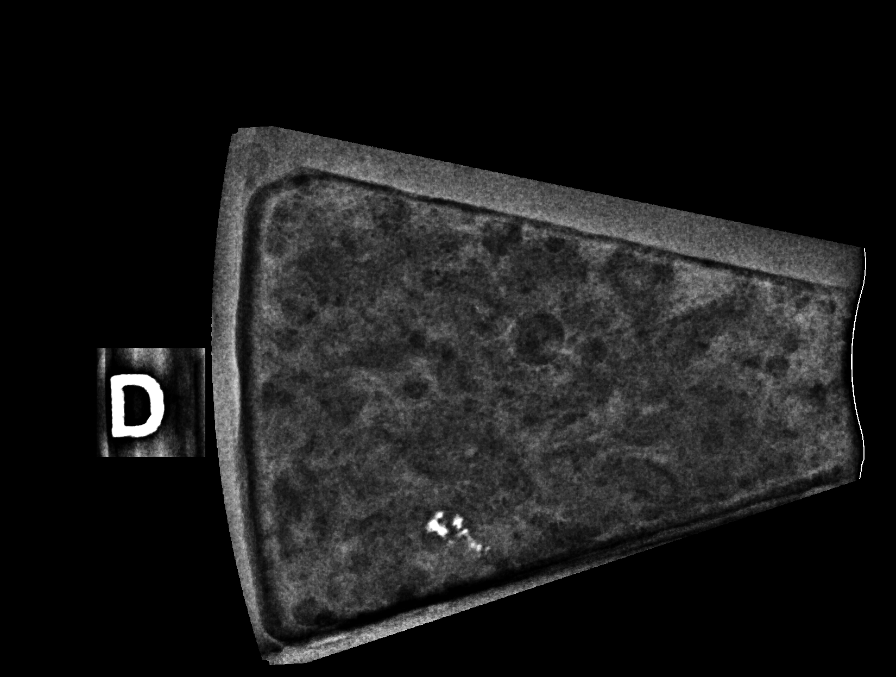

[R (5 of 6)]
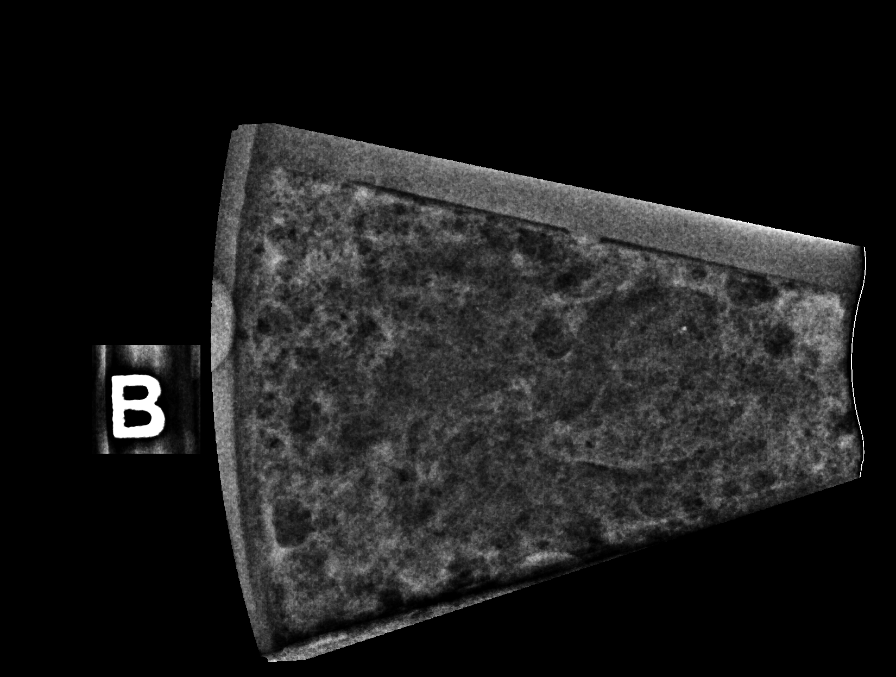

[R (6 of 6)]
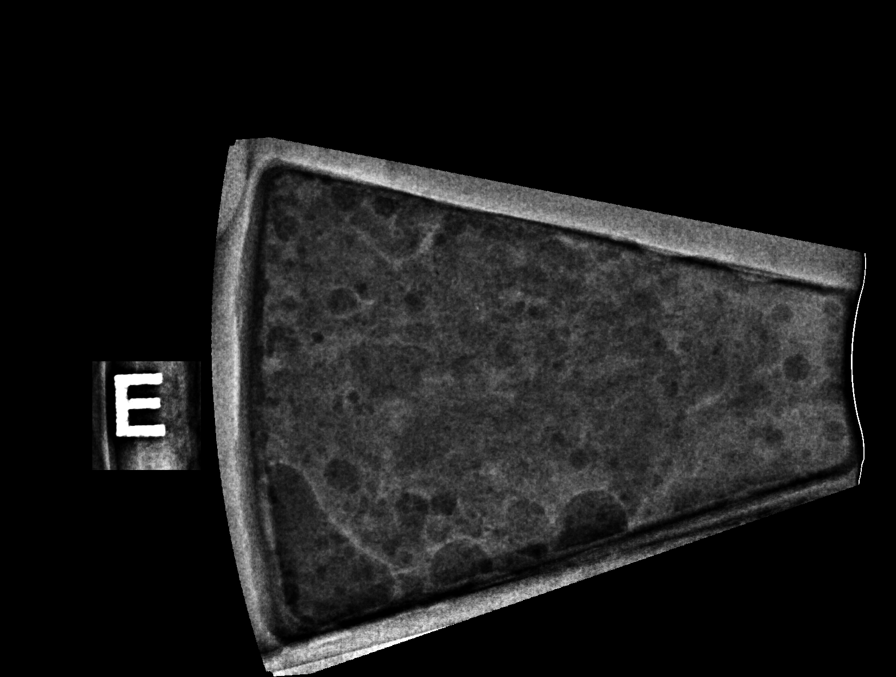

[R CC]
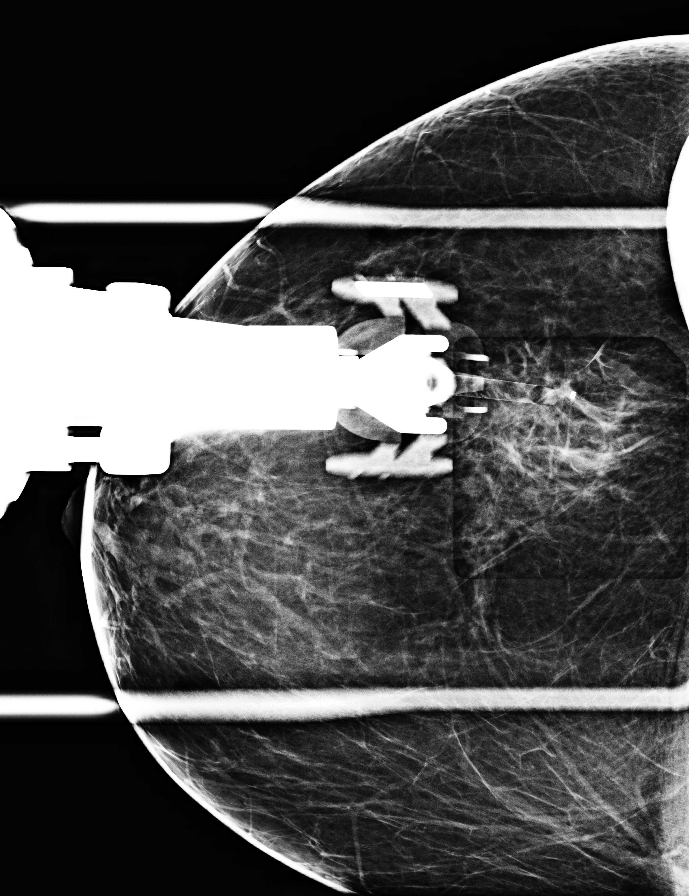

[R CC tomo · tomo slice 40/79.0]
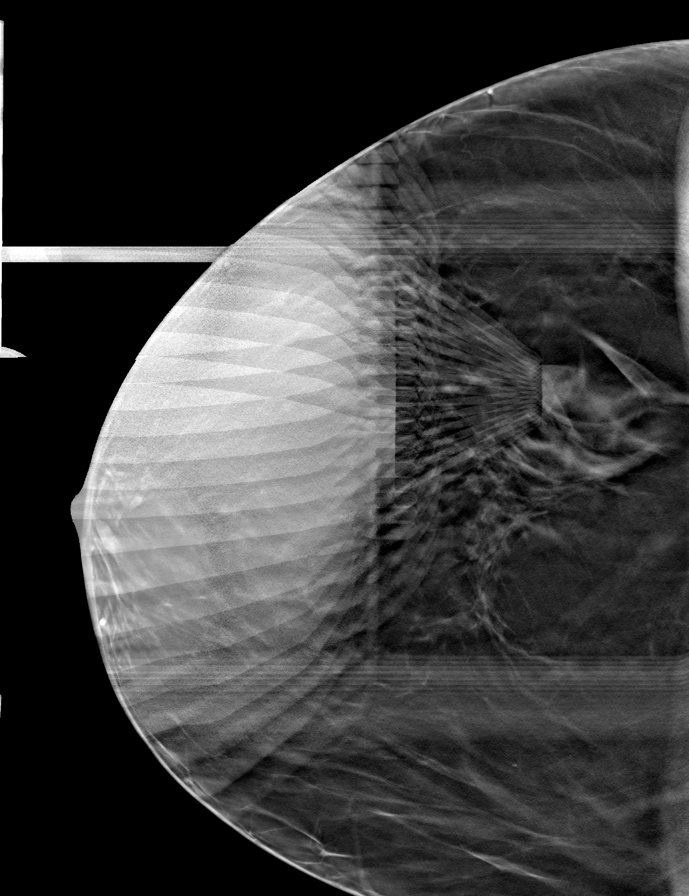

[8 of 22 positions shown; findings below may reference images not displayed]



Using sterile technique and 1% Lidocaine as local anesthetic, under
stereotactic guidance, a 9 gauge vacuum assisted device was used to
perform core needle biopsy of calcifications in the upper outer
quadrant of the right breast using a superior approach. Specimen
radiograph was performed showing multiple calcifications for which
biopsy was performed. Specimens with calcifications are identified
for pathology.

Lesion quadrant: Upper outer quadrant

At the conclusion of the procedure, coil shaped tissue marker clip
was deployed into the biopsy cavity. Follow-up 2-view mammogram was
performed and dictated separately.
IMPRESSION: Stereotactic-guided biopsy of right breast calcifications. No
apparent complications.

ADDENDUM:
Pathology revealed FIBROADENOMATOID NODULE WITH CALCIFICATIONS- NO
MALIGNANCY IDENTIFIED of the RIGHT breast, upper outer quadrant.
This was found to be concordant by Dr. Bonita Luke.

Pathology results were discussed with the patient by telephone. The
patient reported doing well after the biopsy with tenderness at the
site. Post biopsy instructions and care were reviewed and questions
were answered. The patient was encouraged to call The [REDACTED]

The patient was instructed to return for annual screening
mammography due November 2021 and informed a reminder notice would
be sent regarding this appointment.

Pathology results reported by Mhaeceejhe Wagstaff RN on 12/30/2020.



Using sterile technique and 1% Lidocaine as local anesthetic, under
stereotactic guidance, a 9 gauge vacuum assisted device was used to
perform core needle biopsy of calcifications in the upper outer
quadrant of the right breast using a superior approach. Specimen
radiograph was performed showing multiple calcifications for which
biopsy was performed. Specimens with calcifications are identified
for pathology.

Lesion quadrant: Upper outer quadrant

At the conclusion of the procedure, coil shaped tissue marker clip
was deployed into the biopsy cavity. Follow-up 2-view mammogram was
performed and dictated separately.
IMPRESSION: Stereotactic-guided biopsy of right breast calcifications. No
apparent complications.

## 2022-07-06 ENCOUNTER — Other Ambulatory Visit: Payer: Self-pay | Admitting: Obstetrics & Gynecology

## 2022-07-06 DIAGNOSIS — Z1231 Encounter for screening mammogram for malignant neoplasm of breast: Secondary | ICD-10-CM

## 2022-07-31 ENCOUNTER — Ambulatory Visit
Admission: RE | Admit: 2022-07-31 | Discharge: 2022-07-31 | Disposition: A | Discharge status: Home / Self Care | Payer: BC Managed Care – PPO | Source: Ambulatory Visit | Attending: Obstetrics & Gynecology | Admitting: Obstetrics & Gynecology

## 2022-07-31 DIAGNOSIS — Z1231 Encounter for screening mammogram for malignant neoplasm of breast: Secondary | ICD-10-CM

## 2023-02-19 ENCOUNTER — Encounter: Payer: Self-pay | Admitting: Gastroenterology

## 2023-04-03 ENCOUNTER — Ambulatory Visit (AMBULATORY_SURGERY_CENTER): Payer: BC Managed Care – PPO | Admitting: *Deleted

## 2023-04-03 VITALS — Ht 65.75 in | Wt 138.0 lb

## 2023-04-03 DIAGNOSIS — Z1211 Encounter for screening for malignant neoplasm of colon: Secondary | ICD-10-CM

## 2023-04-03 MED ORDER — NA SULFATE-K SULFATE-MG SULF 17.5-3.13-1.6 GM/177ML PO SOLN: 1.0000 | Freq: Once | ORAL | 0 refills | Status: AC

## 2023-04-03 NOTE — Progress Notes (Signed)
Pt's previsit is done over the phone and all paperwork (prep instructions) sent to patient.  Pt's name and DOB verified at the beginning of the previsit.  Pt denies any difficulty with ambulating.   No egg or soy allergy known to patient  No issues known to pt with past sedation with any surgeries or procedures Patient denies ever being told they had issues or difficulty with intubation  No FH of Malignant Hyperthermia Pt is not on diet pills Pt is not on  home 02  Pt is not on blood thinners  Pt denies issues with constipation  Pt is not on dialysis Pt denies any upcoming cardiac testing Pt encouraged to use to use Singlecare or Goodrx to reduce cost  Clarified on instructions mailed to pt- stop taking Semaglutide 04-19-23 (pt takes medication on Fridays).  Pt is aware she must stop medication 7 days prior to procedure.  Patient's chart reviewed by Cathlyn Parsons CNRA prior to previsit and patient appropriate for the [facility].  Previsit completed and red dot placed by patient's name on their procedure day (on provider's schedule).

## 2023-04-28 ENCOUNTER — Encounter: Payer: Self-pay | Admitting: Certified Registered Nurse Anesthetist

## 2023-04-30 ENCOUNTER — Ambulatory Visit (AMBULATORY_SURGERY_CENTER): Payer: BC Managed Care – PPO | Admitting: Gastroenterology

## 2023-04-30 ENCOUNTER — Encounter: Payer: Self-pay | Admitting: Gastroenterology

## 2023-04-30 VITALS — BP 105/54 | HR 68 | Temp 99.3°F | Resp 15 | Ht 65.75 in | Wt 138.0 lb

## 2023-04-30 DIAGNOSIS — Z1211 Encounter for screening for malignant neoplasm of colon: Secondary | ICD-10-CM | POA: Diagnosis not present

## 2023-04-30 DIAGNOSIS — Z86010 Personal history of colonic polyps: Secondary | ICD-10-CM

## 2023-04-30 DIAGNOSIS — Z09 Encounter for follow-up examination after completed treatment for conditions other than malignant neoplasm: Secondary | ICD-10-CM | POA: Diagnosis present

## 2023-04-30 DIAGNOSIS — K529 Noninfective gastroenteritis and colitis, unspecified: Secondary | ICD-10-CM

## 2023-04-30 DIAGNOSIS — K5190 Ulcerative colitis, unspecified, without complications: Secondary | ICD-10-CM | POA: Diagnosis not present

## 2023-04-30 MED ORDER — SODIUM CHLORIDE 0.9 % IV SOLN: 500.0000 mL | INTRAVENOUS | Status: DC

## 2023-04-30 NOTE — Patient Instructions (Addendum)
Handout on hemorrhoids an diverticulosis given to patient Await pathology results Resume previous diet and continue present medications Repeat colonoscopy in 5 years for surveillance   YOU HAD AN ENDOSCOPIC PROCEDURE TODAY AT THE [facility]:   Refer to the procedure report that was given to you for any specific questions about what was found during the examination.  If the procedure report does not answer your questions, please call your gastroenterologist to clarify.  If you requested that your care partner not be given the details of your procedure findings, then the procedure report has been included in a sealed envelope for you to review at your convenience later.  YOU SHOULD EXPECT: Some feelings of bloating in the abdomen. Passage of more gas than usual.  Walking can help get rid of the air that was put into your GI tract during the procedure and reduce the bloating. If you had a lower endoscopy (such as a colonoscopy or flexible sigmoidoscopy) you may notice spotting of blood in your stool or on the toilet paper. If you underwent a bowel prep for your procedure, you may not have a normal bowel movement for a few days.  Please Note:  You might notice some irritation and congestion in your nose or some drainage.  This is from the oxygen used during your procedure.  There is no need for concern and it should clear up in a day or so.  SYMPTOMS TO REPORT IMMEDIATELY:  Following lower endoscopy (colonoscopy or flexible sigmoidoscopy):  Excessive amounts of blood in the stool  Significant tenderness or worsening of abdominal pains  Swelling of the abdomen that is new, acute  Fever of 100F or higher  For urgent or emergent issues, a gastroenterologist can be reached at any hour by calling [phone]. Do not use MyChart messaging for urgent concerns.    DIET:  We do recommend a small meal at first, but then you may proceed to your regular diet.  Drink plenty of fluids but you  should avoid alcoholic beverages for 24 hours.  ACTIVITY:  You should plan to take it easy for the rest of today and you should NOT DRIVE or use heavy machinery until tomorrow (because of the sedation medicines used during the test).    FOLLOW UP: Our staff will call the number listed on your records the next business day following your procedure.  We will call around 7:15- 8:00 am to check on you and address any questions or concerns that you may have regarding the information given to you following your procedure. If we do not reach you, we will leave a message.     If any biopsies were taken you will be contacted by phone or by letter within the next 1-3 weeks.  Please call us at [phone] if you have not heard about the biopsies in 3 weeks.    SIGNATURES/CONFIDENTIALITY: You and/or your care partner have signed paperwork which will be entered into your electronic medical record.  These signatures attest to the fact that that the information above on your After Visit Summary has been reviewed and is understood.  Full responsibility of the confidentiality of this discharge information lies with you and/or your care-partner.

## 2023-04-30 NOTE — Progress Notes (Unsigned)
Pt's states no medical or surgical changes since previsit or office visit. 

## 2023-04-30 NOTE — Progress Notes (Signed)
Called to room to assist during endoscopic procedure.  Patient ID and intended procedure confirmed with present staff. Received instructions for my participation in the procedure from the performing physician.  

## 2023-04-30 NOTE — Progress Notes (Unsigned)
Report given to PACU, vss 

## 2023-04-30 NOTE — Progress Notes (Unsigned)
[facility] History and Physical   Primary Care Physician:  Dois Davenport, MD   Reason for Procedure:  History of adenomatous colon polyps, h/o IBD  Plan:    Surveillance colonoscopy with possible interventions as needed     HPI: Alisha Welch is a very pleasant 67 y.o. female here for surveillance colonoscopy. Denies any nausea, vomiting, abdominal pain, melena or bright red blood per rectum  The risks and benefits as well as alternatives of endoscopic procedure(s) have been discussed and reviewed. All questions answered. The patient agrees to proceed.    Past Medical History:  Diagnosis Date   Allergy    History of blood clot    Blood clot in left arm after skiing accident in 20's   Hyperlipidemia    unsure of this dx   Hyperplastic colon polyp    Hypothyroidism     Past Surgical History:  Procedure Laterality Date   BREAST BIOPSY Left 1989   COLONOSCOPY     SHOULDER SURGERY Left    TRIGGER FINGER RELEASE  06/13/2012   Procedure: RELEASE TRIGGER FINGER/A-1 PULLEY;  Surgeon: Nicki Reaper, MD;  Location: [facility];  Service: Orthopedics;  Laterality: Right;  trigger release right thumb     Prior to Admission medications   Medication Sig Start Date End Date Taking? Authorizing Provider  levothyroxine (SYNTHROID) 100 MCG tablet Take 100 mcg by mouth daily before breakfast.   Yes [provider]  cetirizine (ZYRTEC) 5 MG chewable tablet Chew by mouth. OTC- PRN    [provider]  fluticasone (FLONASE) 50 MCG/ACT nasal spray SPRAY 2 SPRAYS INTO EACH NOSTRIL EVERY DAY Patient not taking: Reported on 04/03/2023 11/19/17   Georgina Quint, MD  gabapentin (NEURONTIN) 300 MG capsule Take 300 mg by mouth daily. PRN 10/29/22   [provider]  OZEMPIC, 1 MG/DOSE, 4 MG/3ML SOPN once a week. 04/11/22   [provider]    Current Outpatient Medications  Medication Sig Dispense Refill   levothyroxine  (SYNTHROID) 100 MCG tablet Take 100 mcg by mouth daily before breakfast.     cetirizine (ZYRTEC) 5 MG chewable tablet Chew by mouth. OTC- PRN     fluticasone (FLONASE) 50 MCG/ACT nasal spray SPRAY 2 SPRAYS INTO EACH NOSTRIL EVERY DAY (Patient not taking: Reported on 04/03/2023) 17 g 0   gabapentin (NEURONTIN) 300 MG capsule Take 300 mg by mouth daily. PRN     OZEMPIC, 1 MG/DOSE, 4 MG/3ML SOPN once a week.     Current Facility-Administered Medications  Medication Dose Route Frequency Provider Last Rate Last Admin   0.9 %  sodium chloride infusion  500 mL Intravenous Continuous Quartez Lagos, Eleonore Chiquito, MD        Allergies as of 04/30/2023   (No Known Allergies)    Family History  Problem Relation Age of Onset   Stroke Mother    Heart disease Mother    Lung cancer Father    Heart disease Brother    Colon cancer Maternal Grandmother 38   Breast cancer Paternal Aunt    Esophageal cancer Neg Hx    Rectal cancer Neg Hx    Stomach cancer Neg Hx     Social History   Socioeconomic History   Marital status: Single    Spouse name: Not on file   Number of children: 3   Years of education: Not on file   Highest education level: Not on file  Occupational History   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  Tobacco Use   Smoking status: Never   Smokeless tobacco: Never  Vaping Use   Vaping status: Never Used  Substance and Sexual Activity   Alcohol use: Yes    Comment: rare   Drug use: No   Sexual activity: Yes    Birth control/protection: Post-menopausal  Other Topics Concern   Not on file  Social History Narrative   Not on file   Social Determinants of Health   Financial Resource Strain: Not on file  Food Insecurity: Not on file  Transportation Needs: Not on file  Physical Activity: Not on file  Stress: Not on file  Social Connections: Unknown (02/19/2022)   [facility]   Social Network    Social Network: Not on file  Intimate Partner Violence: Unknown (01/11/2022)    Received from [facility]   HITS    Physically Hurt: Not on file    Insult or Talk Down To: Not on file    Threaten Physical Harm: Not on file    Scream or Curse: Not on file    Review of Systems:  All other review of systems negative except as mentioned in the HPI.  Physical Exam: Vital signs in last 24 hours: BP 118/71   Pulse 62   Temp 99.3 F (37.4 C) (Temporal)   Resp 12   Ht 5' 5.75" (1.67 m)   Wt 138 lb (62.6 kg)   LMP  (LMP Unknown)   SpO2 99%   BMI 22.44 kg/m  General:   Alert, NAD Lungs:  Clear .   Heart:  Regular rate and rhythm Abdomen:  Soft, nontender and nondistended. Neuro/Psych:  Alert and cooperative. Normal mood and affect. A and O x 3  Reviewed labs, radiology imaging, old records and pertinent past GI work up  Patient is appropriate for planned procedure(s) and anesthesia in an ambulatory setting   K. Scherry Ran , MD [phone]

## 2023-04-30 NOTE — Op Note (Signed)
[facility] Patient Name: Alisha Welch Procedure Date: 04/30/2023 3:25 PM MRN: [id number] Endoscopist: Napoleon Form , MD, [id number] Age: 67 Referring MD:  Date of Birth: Aug 11, 1956 Gender: Female Account #: [id number] Procedure:                Colonoscopy Indications:              High risk colon cancer surveillance: Personal                            history of colonic polyps, High risk colon cancer                            surveillance: Inflammatory bowel disease                            (unclassified) of 8 (or more) years duration with                            one-third (or more) of the colon involved Medicines:                Monitored Anesthesia Care Procedure:                Pre-Anesthesia Assessment:                           - Prior to the procedure, a History and Physical                            was performed, and patient medications and                            allergies were reviewed. The patient's tolerance of                            previous anesthesia was also reviewed. The risks                            and benefits of the procedure and the sedation                            options and risks were discussed with the patient.                            All questions were answered, and informed consent                            was obtained. Prior Anticoagulants: The patient has                            taken no anticoagulant or antiplatelet agents. ASA                            Grade Assessment: II - A patient with mild systemic  disease. After reviewing the risks and benefits,                            the patient was deemed in satisfactory condition to                            undergo the procedure.                           After obtaining informed consent, the colonoscope                            was passed under direct vision. Throughout the                            procedure, the patient's  blood pressure, pulse, and                            oxygen saturations were monitored continuously. The                            Olympus Scope [id number] was introduced through the                            anus and advanced to the the terminal ileum, with                            identification of the appendiceal orifice and IC                            valve. The colonoscopy was performed without                            difficulty. The patient tolerated the procedure                            well. The quality of the bowel preparation was                            good. The ileocecal valve, appendiceal orifice, and                            rectum were photographed. Scope In: 3:34:29 PM Scope Out: 3:47:45 PM Scope Withdrawal Time: 0 hours 10 minutes 2 seconds  Total Procedure Duration: 0 hours 13 minutes 16 seconds  Findings:                 The perianal and digital rectal examinations were                            normal.                           Inflammation was not found based on the endoscopic  appearance of the mucosa in the colon. This was                            graded as Mayo Score 0 (normal or inactive                            disease), and when compared to the previous                            examination, the findings are improved. Biopsies                            were taken with a cold forceps for histology from                            cecum, ascending, colon, transverse colon,                            descending, sigmoid colon and rectum.                           Multiple large-mouthed, medium-mouthed and                            small-mouthed diverticula were found in the sigmoid                            colon, descending colon, transverse colon and                            ascending colon.                           Non-bleeding internal hemorrhoids were found during                            retroflexion.  The hemorrhoids were small. Complications:            No immediate complications. Estimated Blood Loss:     Estimated blood loss was minimal. 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                            Biopsied.                           - Moderate diverticulosis in the sigmoid colon, in                            the descending colon, in the transverse colon and  in the ascending colon.                           - Non-bleeding internal hemorrhoids. Recommendation:           - Resume previous diet.                           - Continue present medications.                           - Await pathology results.                           - Repeat colonoscopy in 5 years for surveillance. Napoleon Form, MD 04/30/2023 3:56:22 PM This report has been signed electronically.

## 2023-05-01 ENCOUNTER — Encounter: Payer: Self-pay | Admitting: Gastroenterology

## 2023-05-01 ENCOUNTER — Telehealth: Payer: Self-pay

## 2023-05-01 NOTE — Telephone Encounter (Signed)
  Follow up Call-     04/30/2023    2:30 PM  Call back number  Post procedure Call Back phone  # [phone]  Permission to leave phone message Yes     Patient questions:  Do you have a fever, pain , or abdominal swelling? No. Pain Score  0 *  Have you tolerated food without any problems? Yes.    Have you been able to return to your normal activities? Yes.    Do you have any questions about your discharge instructions: Diet   No. Medications  No. Follow up visit  No.  Do you have questions or concerns about your Care? No.  Actions: * If pain score is 4 or above: No action needed, pain <4.

## 2023-05-10 ENCOUNTER — Encounter: Payer: Self-pay | Admitting: Gastroenterology

## 2023-07-22 ENCOUNTER — Other Ambulatory Visit: Payer: Self-pay | Admitting: Obstetrics & Gynecology

## 2023-07-22 DIAGNOSIS — Z1231 Encounter for screening mammogram for malignant neoplasm of breast: Secondary | ICD-10-CM

## 2023-08-14 ENCOUNTER — Ambulatory Visit
Admission: RE | Admit: 2023-08-14 | Discharge: 2023-08-14 | Disposition: A | Discharge status: Home / Self Care | Payer: BC Managed Care – PPO | Source: Ambulatory Visit | Attending: Obstetrics & Gynecology | Admitting: Obstetrics & Gynecology

## 2023-08-14 DIAGNOSIS — Z1231 Encounter for screening mammogram for malignant neoplasm of breast: Secondary | ICD-10-CM

## 2024-01-13 ENCOUNTER — Encounter: Payer: Self-pay | Admitting: Neurology

## 2024-03-10 ENCOUNTER — Ambulatory Visit (INDEPENDENT_AMBULATORY_CARE_PROVIDER_SITE_OTHER): Admitting: Neurology

## 2024-03-10 ENCOUNTER — Encounter: Payer: Self-pay | Admitting: Neurology

## 2024-03-10 VITALS — BP 126/72 | HR 66 | Ht 65.75 in | Wt 164.0 lb

## 2024-03-10 DIAGNOSIS — G500 Trigeminal neuralgia: Secondary | ICD-10-CM | POA: Diagnosis not present

## 2024-03-10 NOTE — Patient Instructions (Signed)
 If you would like to proceed with imaging, please contact my office

## 2024-03-10 NOTE — Progress Notes (Signed)
 [facility] Clinic Note - Initial Visit   Date: 03/10/2024   Alisha Welch MRN: [id number] DOB: 03/18/1956   Dear Dr. Kurt Phi:  Thank you for your kind referral of Alisha Welch for consultation of right facial pain. Although her history is well known to you, please allow us  to reiterate it for the purpose of our medical record. The patient was accompanied to the clinic by self.    Alisha Welch is a 68 y.o. right-handed female with hypothyroidism and hyperlipidemia presenting for evaluation of right facial pain.   IMPRESSION/PLAN: Right trigeminal neuralgia manifesting with episodic severe right facial pain.  Currently she is minimally symptomatic.    - MRI trigeminal nerve protocol discussed.  She will talk to her husband and notify us , if she would like to proceed  - Take gabapentin 300mg  daily. If symptoms get worse, ok to take 600mg  TID, or increase to 900mg  TID  - Going forward, carbamazepine can be tried  Return to clinic in 6 months  ------------------------------------------------------------- History of present illness: In 2019, she developed right jaw pain, which was constant achy and sharp shooting pain if she clenched her jaw.  It lasted about 2 weeks.  She saw her dentist who did not find any dental issues.  She saw urgent care who diagnosed her with trigeminal neuralgia.   She was doing well until November 2024, when she developed similar symptoms.  She began having right side jaw pain, which radiates towards her cheek, nose, and forehead.  Symptoms was worse with chewing, talking, brushing teeth.  Severe pain would last less than a few minutes.  She went to urgent care and was given prednisone  and gabapentin 300mg  TID.  Her pain steadily improved over the following 1-2 weeks and she was able to stop gabapentin. In April, she had similar pain return so gabapentin was restarted and increased to 600mg  three times daily.  She was also prescribed  prednisone  which improved her pain. She is currently taking gabapentin 300mg  daily.   Out-side paper records, electronic medical record, and images have been reviewed where available and summarized as:  Lab Results  Component Value Date   HGBA1C 5.5 04/06/2013   Lab Results  Component Value Date   VITAMINB12 290 10/17/2016   Lab Results  Component Value Date   TSH 1.880 02/04/2018   Lab Results  Component Value Date   ESRSEDRATE 14 09/13/2017   POCTSEDRATE 33 (A) 04/26/2012    Past Medical History:  Diagnosis Date   Allergy    History of blood clot    Blood clot in left arm after skiing accident in 20's   Hyperlipidemia    unsure of this dx   Hyperplastic colon polyp    Hypothyroidism     Past Surgical History:  Procedure Laterality Date   BREAST BIOPSY Left 1989   BREAST BIOPSY Right 12/29/2020   benign   COLONOSCOPY     SHOULDER SURGERY Left    TRIGGER FINGER RELEASE  06/13/2012   Procedure: RELEASE TRIGGER FINGER/A-1 PULLEY;  Surgeon: Kemp Patter, MD;  Location: [facility];  Service: Orthopedics;  Laterality: Right;  trigger release right thumb      Medications:  Outpatient Encounter Medications as of 03/10/2024  Medication Sig   cetirizine  (ZYRTEC ) 5 MG chewable tablet Chew by mouth. OTC- PRN   gabapentin (NEURONTIN) 600 MG tablet Take 600 mg by mouth 3 (three) times daily. PRN   levothyroxine  (SYNTHROID ) 100 MCG tablet  Take 100 mcg by mouth daily before breakfast.   [DISCONTINUED] fluticasone  (FLONASE ) 50 MCG/ACT nasal spray SPRAY 2 SPRAYS INTO EACH NOSTRIL EVERY DAY (Patient not taking: Reported on 03/10/2024)   [DISCONTINUED] OZEMPIC, 1 MG/DOSE, 4 MG/3ML SOPN once a week. (Patient not taking: Reported on 03/10/2024)   No facility-administered encounter medications on file as of 03/10/2024.    Allergies: No Known Allergies  Family History: Family History  Problem Relation Age of Onset   Stroke Mother    Heart disease Mother    Lung cancer  Father    Heart disease Brother    Breast cancer Paternal Aunt    Ovarian cancer Maternal Grandmother    Colon cancer Maternal Grandmother 83   Esophageal cancer Neg Hx    Rectal cancer Neg Hx    Stomach cancer Neg Hx     Social History: Social History   Tobacco Use   Smoking status: Never   Smokeless tobacco: Never  Vaping Use   Vaping status: Never Used  Substance Use Topics   Alcohol use: Yes    Comment: rare   Drug use: No   Social History   Social History Narrative   Are you right handed or left handed? Right Handed   Are you currently employed ? 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 No    Who lives with you? Husband    What type of home do you live in: 1 story or 2 story? Lives in a two story home        Vital Signs:  BP 126/72   Pulse 66   Ht 5' 5.75" (1.67 m)   Wt 164 lb (74.4 kg)   LMP  (LMP Unknown)   SpO2 99%   BMI 26.67 kg/m    Neurological Exam: MENTAL STATUS including orientation to time, place, person, recent and remote memory, attention span and concentration, language, and fund of knowledge is normal.  Speech is not dysarthric.  CRANIAL NERVES: II:  No visual field defects.     III-IV-VI: Pupils equal round and reactive to light.  Normal conjugate, extra-ocular eye movements in all directions of gaze.  No nystagmus.  No ptosis.   V:  Normal facial sensation.    VII:  Normal facial symmetry and movements.   VIII:  Normal hearing and vestibular function.   IX-X:  Normal palatal movement.   XI:  Normal shoulder shrug and head rotation.   XII:  Normal tongue strength and range of motion, no deviation or fasciculation.  MOTOR:  Motor strength is 5/5 throughout.  No atrophy, fasciculations or abnormal movements.  No pronator drift.   MSRs:                                           Right        Left brachioradialis 2+  2+  biceps 2+  2+  triceps 2+  2+  patellar 2+  2+  ankle jerk 2+  2+   Hoffman no  no  plantar response down  down   SENSORY:  Normal and symmetric perception of light touch, pinprick, vibration, and temperature.    COORDINATION/GAIT: Normal finger-to- nose-finger.  Intact rapid alternating movements bilaterally.   Gait narrow based and stable. Tandem gait intact.     Thank you for  allowing me to participate in patient's care.  If I can answer any additional questions, I would be pleased to do so.    Sincerely,    Zeniya Lapidus K. Lydia Sams, DO

## 2024-03-30 ENCOUNTER — Telehealth: Payer: Self-pay | Admitting: Neurology

## 2024-03-30 DIAGNOSIS — G500 Trigeminal neuralgia: Secondary | ICD-10-CM

## 2024-03-30 NOTE — Telephone Encounter (Signed)
 See previous encounter

## 2024-03-30 NOTE — Telephone Encounter (Signed)
 Called patient and left a message for a call back.   MRI order has been sent in.

## 2024-03-30 NOTE — Telephone Encounter (Signed)
 Pt called in stating she is now wanting to get the MRI done.

## 2024-03-30 NOTE — Telephone Encounter (Signed)
 Pt called in returning Mahina's call. I let her know the MRI order was placed. She stated she already spoke with someone and scheduled an appointment. She doesn't know if Mahina needed anything else or not. If not then she doesn't need a call back

## 2024-03-30 NOTE — Telephone Encounter (Signed)
 Yes, please order MRI trigeminal nerve wwo contrast.

## 2024-03-31 ENCOUNTER — Encounter: Payer: Self-pay | Admitting: Neurology

## 2024-04-14 ENCOUNTER — Other Ambulatory Visit (HOSPITAL_BASED_OUTPATIENT_CLINIC_OR_DEPARTMENT_OTHER): Payer: Self-pay | Admitting: Obstetrics & Gynecology

## 2024-04-14 DIAGNOSIS — E2839 Other primary ovarian failure: Secondary | ICD-10-CM

## 2024-04-25 ENCOUNTER — Ambulatory Visit
Admission: RE | Admit: 2024-04-25 | Discharge: 2024-04-25 | Disposition: A | Discharge status: Home / Self Care | Source: Ambulatory Visit | Attending: Neurology

## 2024-04-25 DIAGNOSIS — G500 Trigeminal neuralgia: Secondary | ICD-10-CM

## 2024-04-25 MED ORDER — GADOPICLENOL 0.5 MMOL/ML IV SOLN
7.0000 mL | Freq: Once | INTRAVENOUS | Status: AC | PRN
  Administered 2024-04-25: 7 mL via INTRAVENOUS

## 2024-04-30 ENCOUNTER — Ambulatory Visit: Payer: Self-pay | Admitting: Neurology

## 2024-05-13 NOTE — Telephone Encounter (Signed)
Pt is returning a call to mahina 

## 2024-05-23 ENCOUNTER — Encounter: Payer: Self-pay | Admitting: Neurology

## 2024-09-14 ENCOUNTER — Ambulatory Visit: Admitting: Neurology

## 2024-11-10 ENCOUNTER — Ambulatory Visit: Admitting: Neurology

## 2024-11-10 ENCOUNTER — Encounter: Payer: Self-pay | Admitting: Neurology

## 2024-11-10 VITALS — BP 125/70 | HR 65 | Ht 65.75 in | Wt 168.0 lb

## 2024-11-10 DIAGNOSIS — G500 Trigeminal neuralgia: Secondary | ICD-10-CM | POA: Diagnosis not present

## 2024-11-10 MED ORDER — GABAPENTIN 600 MG PO TABS: 600.0000 mg | ORAL_TABLET | Freq: Three times a day (TID) | ORAL | 5 refills | Status: AC

## 2024-11-10 NOTE — Progress Notes (Signed)
" ° ° °  Follow-up Visit   Date: 11/10/2024    Alisha Welch MRN: [id number] DOB: 09/06/1956    Alisha Welch is a 69 y.o. right-handed female with hypertension and hyperlipidemia returning to the clinic for follow-up of right trigeminal neuralgia.  The patient was accompanied to the clinic by self.  IMPRESSION/PLAN: Assessment & Plan Right trigeminal neuralgia Chronic right trigeminal neuralgia with significant pain control improvement, experiencing brief mild episodes managed with intermittent gabapentin . - Continue to take gabapentin  600 mg TID for pain flares as needed  Return to clinic in 9 months  --------------------------------------------- UPDATE 11/10/2024: Discussed the use of AI scribe software for clinical note transcription with the patient, who gave verbal consent to proceed.  History of Present Illness Alisha Welch is a 69 year old female with trigeminal neuralgia presenting for follow-up of facial pain management.  She continues to experience episodic right facial pain. Her last significant flare occurred in June 2025 and lasted approximately seven days. Since then, she has experienced minor episodes two to four times per month, which she manages with gabapentin  600 mg as needed. She does not take gabapentin  daily, but initiates therapy at the onset of prodromal symptoms, such as a sensation in her right posterior tooth, and continues dosing up to three times daily during pain spells until symptoms resolve. Recent episodes have lasted only a couple of days. During flares, she describes the pain as severe, but finds gabapentin  generally effective for her current symptom pattern.     Medications:  Medications Ordered Prior to Encounter[1]  Allergies: Allergies[2]  Vital Signs:  LMP  (LMP Unknown)     Neurological Exam: MENTAL STATUS including orientation to time, place, person, recent and remote memory, attention span and concentration, language, and fund of  knowledge is normal.  Speech is not dysarthric.  CRANIAL NERVES:  Pupils equal round and reactive to light.  Normal conjugate, extra-ocular eye movements in all directions of gaze.  No ptosis.  Facial sensation intact.  Face is symmetric.   MOTOR:  Motor strength is 5/5 in all extremities  MSRs:  Reflexes are 2+/4 throughout.  SENSORY:  Intact to temperature throughout.  COORDINATION/GAIT:  Normal finger-to- nose-finger.  Intact rapid alternating movements bilaterally.  Gait narrow based and stable.   Data: MRI trigeminal nerve wwo contrast 04/25/2024:  Negative trigeminal nerve imaging.    Thank you for allowing me to participate in patient's care.  If I can answer any additional questions, I would be pleased to do so.    Sincerely,    Jumanah Hynson K. Tobie, DO      [1]  Current Outpatient Medications on File Prior to Visit  Medication Sig Dispense Refill   cetirizine  (ZYRTEC ) 5 MG chewable tablet Chew by mouth. OTC- PRN     gabapentin  (NEURONTIN ) 600 MG tablet Take 600 mg by mouth 3 (three) times daily. PRN     levothyroxine  (SYNTHROID ) 100 MCG tablet Take 100 mcg by mouth daily before breakfast.     No current facility-administered medications on file prior to visit.  [2] No Known Allergies  "

## 2025-08-10 ENCOUNTER — Ambulatory Visit: Payer: Self-pay | Admitting: Neurology
